# Patient Record
Sex: Female | Born: 1950
Health system: Southern US, Community
[De-identification: ages and names within clinical notes are randomized; demographics above are authoritative.]

## PROBLEM LIST (undated history)

## (undated) DIAGNOSIS — M722 Plantar fascial fibromatosis: Secondary | ICD-10-CM

## (undated) DIAGNOSIS — M810 Age-related osteoporosis without current pathological fracture: Secondary | ICD-10-CM

## (undated) DIAGNOSIS — R252 Cramp and spasm: Secondary | ICD-10-CM

## (undated) DIAGNOSIS — T7840XA Allergy, unspecified, initial encounter: Secondary | ICD-10-CM

## (undated) DIAGNOSIS — M1990 Unspecified osteoarthritis, unspecified site: Secondary | ICD-10-CM

## (undated) DIAGNOSIS — M75100 Unspecified rotator cuff tear or rupture of unspecified shoulder, not specified as traumatic: Secondary | ICD-10-CM

## (undated) HISTORY — PX: OTHER SURGICAL HISTORY: SHX169

## (undated) HISTORY — PX: DENTAL SURGERY: SHX609

## (undated) HISTORY — DX: Cramp and spasm: R25.2

## (undated) HISTORY — DX: Age-related osteoporosis without current pathological fracture: M81.0

## (undated) HISTORY — DX: Plantar fascial fibromatosis: M72.2

## (undated) HISTORY — DX: Allergy, unspecified, initial encounter: T78.40XA

---

## 1999-09-30 ENCOUNTER — Encounter: Admission: RE | Admit: 1999-09-30 | Discharge: 1999-09-30 | Payer: Self-pay | Admitting: Obstetrics and Gynecology

## 1999-09-30 ENCOUNTER — Encounter: Payer: Self-pay | Admitting: Obstetrics and Gynecology

## 2009-05-23 LAB — HM MAMMOGRAPHY: HM Mammogram: ABNORMAL

## 2011-10-26 ENCOUNTER — Ambulatory Visit (INDEPENDENT_AMBULATORY_CARE_PROVIDER_SITE_OTHER): Payer: BC Managed Care – PPO | Admitting: Sports Medicine

## 2011-10-26 VITALS — BP 102/62 | Wt 122.0 lb

## 2011-10-26 DIAGNOSIS — M7750 Other enthesopathy of unspecified foot: Secondary | ICD-10-CM

## 2011-10-26 DIAGNOSIS — M7740 Metatarsalgia, unspecified foot: Secondary | ICD-10-CM | POA: Insufficient documentation

## 2011-10-26 NOTE — Assessment & Plan Note (Signed)
We have fitted some of her current orthotics with metatarsal cookies and given her a pair to take home.  She will do a trial of these for one month.

## 2011-10-26 NOTE — Progress Notes (Signed)
  Subjective:    Patient ID: Jeanne Webb, female    DOB: 1951/05/04, 61 y.o.   MRN: [id number]  HPI 61 y/o female is here c/o left foot pain.  No injury.  She was wearing orthotics at some point for plantar fasciitis which has resolved.  When she was wearing her orthotics she was having numbness on the lateral surface of the foot.  This improved when she stopped wearing the orthotics and purchased shoes with a wider toe box.  Her pain now is on the plantar surface at the forefoot.  This pain is made worse with barefoot walking.   Review of Systems     Objective:   Physical Exam  Left foot: Loss of the transverse arch with widening Hammering and splaying  of 2-4 toes Tenderness to palpation of 2-4 metatarsal heads There is collapse of the the 2-4 metatarsal heads with callus formation Tenderness to palpation over the sesamoid of the 1st metatarsal  Right foot: Loss of transverse arch Large bunion No tenderness of the metatarsal heads but there is callus formation  Gait: The pain is improved somewhat with metatarsal pads but more so with metatarsal cookies.         Assessment & Plan:

## 2012-07-08 ENCOUNTER — Encounter: Payer: Self-pay | Admitting: Geriatric Medicine

## 2012-08-25 ENCOUNTER — Other Ambulatory Visit: Payer: Self-pay | Admitting: *Deleted

## 2012-08-25 DIAGNOSIS — Z0000 Encounter for general adult medical examination without abnormal findings: Secondary | ICD-10-CM

## 2012-08-26 ENCOUNTER — Other Ambulatory Visit: Payer: Self-pay

## 2012-08-26 ENCOUNTER — Other Ambulatory Visit: Payer: Self-pay | Admitting: Internal Medicine

## 2012-08-26 ENCOUNTER — Other Ambulatory Visit: Payer: Managed Care, Other (non HMO) | Admitting: Internal Medicine

## 2012-08-26 DIAGNOSIS — Z0000 Encounter for general adult medical examination without abnormal findings: Secondary | ICD-10-CM

## 2012-09-01 ENCOUNTER — Encounter: Payer: Self-pay | Admitting: Internal Medicine

## 2012-09-01 ENCOUNTER — Ambulatory Visit (INDEPENDENT_AMBULATORY_CARE_PROVIDER_SITE_OTHER): Payer: Managed Care, Other (non HMO) | Admitting: Internal Medicine

## 2012-09-01 VITALS — BP 118/72 | HR 68 | Temp 97.9°F | Resp 16 | Wt 123.4 lb

## 2012-09-01 DIAGNOSIS — M169 Osteoarthritis of hip, unspecified: Secondary | ICD-10-CM

## 2012-09-01 DIAGNOSIS — E538 Deficiency of other specified B group vitamins: Secondary | ICD-10-CM

## 2012-09-01 DIAGNOSIS — M1612 Unilateral primary osteoarthritis, left hip: Secondary | ICD-10-CM

## 2012-09-01 DIAGNOSIS — M7750 Other enthesopathy of unspecified foot: Secondary | ICD-10-CM

## 2012-09-01 DIAGNOSIS — E785 Hyperlipidemia, unspecified: Secondary | ICD-10-CM

## 2012-09-01 DIAGNOSIS — M1610 Unilateral primary osteoarthritis, unspecified hip: Secondary | ICD-10-CM

## 2012-09-01 DIAGNOSIS — M7740 Metatarsalgia, unspecified foot: Secondary | ICD-10-CM

## 2012-09-01 NOTE — Assessment & Plan Note (Signed)
Recommended increased activity, exercise to loosen up muscles.

## 2012-09-01 NOTE — Assessment & Plan Note (Signed)
Doing fine with use of cookie.  Still feels the plantar fasciitis at times when running with the grandchildren.

## 2012-09-01 NOTE — Progress Notes (Signed)
  Subjective:    Patient ID: Jeanne Webb, female    DOB: Mar 28, 1951, 62 y.o.   MRN: [id number]  HPI  C/o left hip being tight.  Requests massage prescription.  Has had xrays in the past and has known osteoarthritis present.  Sleeps on back so does not know if it hurts if she sleeps on the left side.  If stands a lot, has left-sided radicular pain.    Metatarsalgia and plantar fasciitis are improved.    Has not been exercising and watching diet too well lately.    Review of Systems   General:  Denies fatigue, denies weight loss Skin:  Denies rashes, denies concerning changes HEENT:  Denies headaches, denies visual changes, denies hearing loss, denies nasal congestion, denies sore throat, denies difficulty swallowing CV:  Denies chest pain, denies dyspnea on exertion, denies orthopnea, denies PND, denies edema Pulm:  Denies shortness of breath, denies wheezing, denies cough GI:  Denies abdominal pain, denies constipation, denies diarrhea, denies melena, denies hematochezia, denies nausea, denies vomiting GU:  Denies urinary frequency, denies urinary urgency, denies dysuria, denies nocturia, denies urinary incontinence Neurologic:  Denies paresthesias, denies new focal weakness MSK:  C/o left hip pain, denies back pain, denies muscle pain Hematology:  Denies bleeding, denies bruising Immunology:  Denies recurrent infections Psychiatry:  Denies changes in memory, denies depression, denies anxiety  Objective:   Physical Exam General:  Well-developed, well-nourished, well-groomed pt in NAD Skin:  No visible lesions on inspection CV:  RRR, no M/G/R, +S1/S2 Pulm:  Lungs CTA, no r/r/w Abd:  Soft, nondistended, nontender, no rebound or guarding, no palpable hepatosplenomegaly GU:  Bladder nontender, nondistended, no CVA tenderness MSK:  5/5 strength in all 4 extremities with normal ROM, gait steady w/o use of assistive device, has mild left hip tenderness at femoral head Psych:  AAOx3,  affect normal    Assessment & Plan:  Osteoarthritis of left hip Recommended increased activity, exercise to loosen up muscles.    Other and unspecified hyperlipidemia LDL is slightly elevated.  HDL is excellent.  Encouraged low cholesterol diet and exercise to promote decreased LDL and increase HDL even more.    Metatarsalgia Doing fine with use of cookie.  Still feels the plantar fasciitis at times when running with the grandchildren.

## 2012-09-01 NOTE — Assessment & Plan Note (Signed)
LDL is slightly elevated.  HDL is excellent.  Encouraged low cholesterol diet and exercise to promote decreased LDL and increase HDL even more.

## 2012-09-01 NOTE — Patient Instructions (Signed)
Increase your physical activity.  Consider heat and ice alternating for about 20 mins each on your left hip.  This may work best right before you exercise to loosen up the joint.    Maintain a low fat, low cholesterol diet.  (see book)

## 2013-03-31 ENCOUNTER — Encounter: Payer: Managed Care, Other (non HMO) | Admitting: Internal Medicine

## 2013-08-04 ENCOUNTER — Ambulatory Visit (INDEPENDENT_AMBULATORY_CARE_PROVIDER_SITE_OTHER): Payer: Managed Care, Other (non HMO) | Admitting: Family Medicine

## 2013-08-04 VITALS — BP 118/68 | HR 83 | Temp 98.4°F | Resp 16 | Ht 65.0 in | Wt 124.0 lb

## 2013-08-04 DIAGNOSIS — A088 Other specified intestinal infections: Secondary | ICD-10-CM

## 2013-08-04 DIAGNOSIS — A084 Viral intestinal infection, unspecified: Secondary | ICD-10-CM

## 2013-08-04 DIAGNOSIS — R197 Diarrhea, unspecified: Secondary | ICD-10-CM

## 2013-08-04 DIAGNOSIS — R110 Nausea: Secondary | ICD-10-CM

## 2013-08-04 LAB — POCT INFLUENZA A/B
INFLUENZA A, POC: NEGATIVE
INFLUENZA B, POC: NEGATIVE

## 2013-08-04 MED ORDER — ONDANSETRON 4 MG PO TBDP: ORAL_TABLET | ORAL | Status: DC

## 2013-08-04 NOTE — Progress Notes (Signed)
Subjective:  Results for orders placed in visit on 08/04/13  POCT INFLUENZA A/B      Result Value Ref Range   Influenza A, POC Negative     Influenza B, POC Negative

## 2013-08-04 NOTE — Patient Instructions (Signed)
Continue to drink plenty of clear liquids  Avoid dairy products  Avoid rich or spicy food, eating only very bland such as crackers or toast or broth tonight. Gradually advance her diet tomorrow as tolerated.  Return if worse, especially with high fevers, abdominal pain, passing blood, or any other major things of concern.  Use the Zofran if needed for nausea or vomiting

## 2013-09-04 ENCOUNTER — Other Ambulatory Visit: Payer: Managed Care, Other (non HMO)

## 2013-09-07 ENCOUNTER — Ambulatory Visit: Payer: Managed Care, Other (non HMO) | Admitting: Internal Medicine

## 2013-09-29 ENCOUNTER — Ambulatory Visit: Payer: Managed Care, Other (non HMO) | Admitting: Internal Medicine

## 2013-12-07 ENCOUNTER — Ambulatory Visit: Payer: Managed Care, Other (non HMO) | Admitting: Internal Medicine

## 2014-01-02 ENCOUNTER — Other Ambulatory Visit: Payer: Managed Care, Other (non HMO)

## 2014-01-02 DIAGNOSIS — E785 Hyperlipidemia, unspecified: Secondary | ICD-10-CM

## 2014-01-02 DIAGNOSIS — E538 Deficiency of other specified B group vitamins: Secondary | ICD-10-CM

## 2014-01-03 LAB — BASIC METABOLIC PANEL
BUN/Creatinine Ratio: 17 (ref 11–26)
BUN: 12 mg/dL (ref 8–27)
CO2: 25 mmol/L (ref 18–29)
Calcium: 9 mg/dL (ref 8.7–10.3)
Chloride: 99 mmol/L (ref 97–108)
Creatinine, Ser: 0.69 mg/dL (ref 0.57–1.00)
GFR calc Af Amer: 106 mL/min/{1.73_m2} (ref >59)
GFR calc non Af Amer: 92 mL/min/{1.73_m2} (ref >59)
Glucose: 89 mg/dL (ref 65–99)
Potassium: 4 mmol/L (ref 3.5–5.2)
Sodium: 140 mmol/L (ref 134–144)

## 2014-01-03 LAB — CBC WITH DIFFERENTIAL/PLATELET
Basophils Absolute: 0.1 10*3/uL (ref 0.0–0.2)
Basos: 2 %
Eos: 2 %
Eosinophils Absolute: 0.1 10*3/uL (ref 0.0–0.4)
HCT: 39 % (ref 34.0–46.6)
Hemoglobin: 13.5 g/dL (ref 11.1–15.9)
Immature Grans (Abs): 0 10*3/uL (ref 0.0–0.1)
Immature Granulocytes: 0 %
Lymphocytes Absolute: 1.9 10*3/uL (ref 0.7–3.1)
Lymphs: 42 %
MCH: 31.1 pg (ref 26.6–33.0)
MCHC: 34.6 g/dL (ref 31.5–35.7)
MCV: 90 fL (ref 79–97)
Monocytes Absolute: 0.3 10*3/uL (ref 0.1–0.9)
Monocytes: 6 %
Neutrophils Absolute: 2.1 10*3/uL (ref 1.4–7.0)
Neutrophils Relative %: 48 %
RBC: 4.34 x10E6/uL (ref 3.77–5.28)
RDW: 12.3 % (ref 12.3–15.4)
WBC: 4.5 10*3/uL (ref 3.4–10.8)

## 2014-01-03 LAB — LIPID PANEL
Chol/HDL Ratio: 2.1 ratio units (ref 0.0–4.4)
Cholesterol, Total: 209 mg/dL — ABNORMAL HIGH (ref 100–199)
HDL: 100 mg/dL (ref >39)
LDL Calculated: 98 mg/dL (ref 0–99)
Triglycerides: 56 mg/dL (ref 0–149)
VLDL Cholesterol Cal: 11 mg/dL (ref 5–40)

## 2014-01-03 LAB — B12 AND FOLATE PANEL
Folate: 17.6 ng/mL (ref >3.0)
Vitamin B-12: 1463 pg/mL — ABNORMAL HIGH (ref 211–946)

## 2014-01-04 ENCOUNTER — Ambulatory Visit: Payer: Self-pay | Admitting: Internal Medicine

## 2014-01-04 ENCOUNTER — Ambulatory Visit (INDEPENDENT_AMBULATORY_CARE_PROVIDER_SITE_OTHER): Payer: Managed Care, Other (non HMO) | Admitting: Internal Medicine

## 2014-01-04 ENCOUNTER — Encounter: Payer: Self-pay | Admitting: *Deleted

## 2014-01-04 ENCOUNTER — Encounter: Payer: Self-pay | Admitting: Internal Medicine

## 2014-01-04 VITALS — BP 108/70 | HR 67 | Temp 98.0°F | Ht 65.0 in | Wt 119.2 lb

## 2014-01-04 DIAGNOSIS — M1612 Unilateral primary osteoarthritis, left hip: Secondary | ICD-10-CM

## 2014-01-04 DIAGNOSIS — Z1231 Encounter for screening mammogram for malignant neoplasm of breast: Secondary | ICD-10-CM

## 2014-01-04 DIAGNOSIS — M7750 Other enthesopathy of unspecified foot: Secondary | ICD-10-CM

## 2014-01-04 DIAGNOSIS — M1610 Unilateral primary osteoarthritis, unspecified hip: Secondary | ICD-10-CM

## 2014-01-04 DIAGNOSIS — Z13220 Encounter for screening for lipoid disorders: Secondary | ICD-10-CM

## 2014-01-04 DIAGNOSIS — M7740 Metatarsalgia, unspecified foot: Secondary | ICD-10-CM

## 2014-01-04 NOTE — Progress Notes (Signed)
Patient ID: Jeanne Webb, female   DOB: 1950/10/16, 63 y.o.   MRN: [id number]   Location:  [facility]   Allergies  Allergen Reactions  . Lactose Intolerance (Gi)   . Penicillins   . Seldane [Terfenadine]   . Water Oral [Alimentum]     Chief Complaint  Patient presents with  . Annual Exam    Physical with labs prior.  Marland Kitchen other    declines Colonoscopy, needs Mammogram, and Dexa done @ [facility] 2-3 yrs ago  . Immunizations    declines all vaccines    HPI: Patient is a 63 y.o. white female seen in the office today for annual exam.    Continues to go to homeopathic medicine specialist in [place].  Went to dentist in [place].  Has a book of bloodwork from there.  Felt to have muscle spasms in left thigh--went to three times per week, and also was started on borswelia.  Refuses to acknowledge arthritis.  Seeing Ruby at the gym for massage.  Continues to do pilates now weekly.  Is trying to use her upper back to prevent injuring her neck.    Busy studying so has not been able to enjoy painting lately.  Had some urethral discomfort and wonders if it looks normal.  Was using small pads and could have been irritating.  Stress incontinence with sneezing.  Review of Systems:  Review of Systems  Constitutional: Negative for fever, chills and malaise/fatigue.  HENT: Negative for congestion.   Eyes: Negative for blurred vision.  Respiratory: Negative for cough and shortness of breath.   Cardiovascular: Negative for chest pain and leg swelling.  Gastrointestinal: Negative for abdominal pain, constipation, blood in stool and melena.  Genitourinary: Positive for dysuria.  Musculoskeletal: Positive for back pain, myalgias and neck pain. Negative for falls.  Skin: Negative for rash.  Neurological: Negative for dizziness, loss of consciousness, weakness and headaches.  Endo/Heme/Allergies: Does not bruise/bleed easily.    Psychiatric/Behavioral: Negative for depression and memory loss.    Past Medical History  Diagnosis Date  . Plantar fascial fibromatosis   . Cramp of limb     History reviewed. No pertinent past surgical history.  Social History:   reports that she has quit smoking. Her smoking use included Cigarettes. She smoked 0.00 packs per day. She does not have any smokeless tobacco history on file. She reports that she does not drink alcohol or use illicit drugs.  Family History  Problem Relation Age of Onset  . Cancer Mother     leukemia, ovarian, breast  . Stroke Father   . Heart disease Brother     Medications: Patient's Medications  New Prescriptions   No medications on file  Previous Medications   ASCORBIC ACID (VITAMIN C) 1000 MG TABLET    Take 1,000 mg by mouth 2 (two) times daily.   B COMPLEX VITAMINS TABLET    Take 1 tablet by mouth daily.   BOSWELLIA SERRATA (BOSWELLIA PO)    Take by mouth. Take 2 tablets daily on the AM   CALCIUM-MAGNESIUM-ZINC 333-133-5 MG TABS    Take two tablets in the morning and two tablets at lunch time   CHOLECALCIFEROL (VITAMIN D) 1000 UNITS TABLET    Take 1,000 Units by mouth 3 (three) times daily.   MAGNESIUM 30 MG TABLET    Take one tablet twice daily   NUTRITIONAL SUPPLEMENTS (JUICE PLUS FIBRE) LIQD    Take two tablet once daily  VITAMIN E 400 UNIT CAPSULE    Take one tablet once daily for supplement  Modified Medications   No medications on file  Discontinued Medications   ONDANSETRON (ZOFRAN ODT) 4 MG DISINTEGRATING TABLET    Take one every 4-6 hours if needed for nausea/vomiting.  May dissolve in mouth.     Physical Exam: Filed Vitals:   01/04/14 1011  BP: 108/70  Pulse: 67  Temp: 98 F (36.7 C)  TempSrc: Oral  Height: 5\' 5"  (1.651 m)  Weight: 119 lb 3.2 oz (54.069 kg)  SpO2: 97%  Physical Exam  Constitutional: She is oriented to person, place, and time. She appears well-developed and well-nourished. No distress.  HENT:   Head: Normocephalic and atraumatic.  Right Ear: External ear normal.  Left Ear: External ear normal.  Nose: Nose normal.  Mouth/Throat: Oropharynx is clear and moist. No oropharyngeal exudate.  TMs pink  Eyes: Conjunctivae and EOM are normal. Pupils are equal, round, and reactive to light.  Neck: Normal range of motion. Neck supple. No JVD present. No thyromegaly present.  Cardiovascular: Normal rate, regular rhythm, normal heart sounds and intact distal pulses.   Pulmonary/Chest: Effort normal and breath sounds normal. No respiratory distress.  Abdominal: Soft. Bowel sounds are normal. She exhibits no distension and no mass. There is no tenderness.  Musculoskeletal: Normal range of motion.  Lymphadenopathy:    She has no cervical adenopathy.  Neurological: She is alert and oriented to person, place, and time. She has normal reflexes. No cranial nerve deficit.  Skin: Skin is warm and dry.  Psychiatric: She has a normal mood and affect. Her behavior is normal. Judgment and thought content normal.    Labs reviewed: Basic Metabolic Panel:  Recent Labs  01/02/14 0814  NA 140  K 4.0  CL 99  CO2 25  GLUCOSE 89  BUN 12  CREATININE 0.69  CALCIUM 9.0  CBC:  Recent Labs  01/02/14 0814  WBC 4.5  NEUTROABS 2.1  HGB 13.5  HCT 39.0  MCV 90   Lipid Panel:  Recent Labs  01/02/14 0814  HDL 100  LDLCALC 98  TRIG 56  CHOLHDL 2.1   Assessment/Plan 1. Metatarsalgia, unspecified laterality -continues to be problematic for her and she believes she can reverse the condition  2. Primary osteoarthritis of left hip -says she will not accept arthritis as part of her vocabulary -seeing chiropractor and massage therapist as well as homeopathic doctor to help with this and has gone back to doing weekly pilates  3. Screening, lipid - last lipids at goal with excellent HDL--will need annual f/u of labs: CBC With differential/Platelet; Future - Comprehensive metabolic panel;  Future - Lipid panel; Future  4. Other screening mammogram -referral done to [facility] Dr. Isaiah Blakes today - MM DIGITAL SCREENING BILATERAL; Future  Labs/tests ordered: Orders Placed This Encounter  Procedures  . MM DIGITAL SCREENING BILATERAL    Standing Status: Future     Number of Occurrences:      Standing Expiration Date: 03/06/2015    Order Specific Question:  Reason for exam:    Answer:  routine screening overdue    Order Specific Question:  Preferred imaging location?    Answer:  External     Comments:  [facility]  . CBC With differential/Platelet    Standing Status: Future     Number of Occurrences:      Standing Expiration Date: 01/05/2016  . Comprehensive metabolic panel    Standing Status: Future  Number of Occurrences:      Standing Expiration Date: 01/05/2016  . Lipid panel    Standing Status: Future     Number of Occurrences:      Standing Expiration Date: 01/05/2016    Next appt:  1 year with labs before

## 2014-02-13 LAB — HM MAMMOGRAPHY

## 2014-05-10 DIAGNOSIS — Z029 Encounter for administrative examinations, unspecified: Secondary | ICD-10-CM

## 2015-01-09 ENCOUNTER — Other Ambulatory Visit: Payer: Managed Care, Other (non HMO)

## 2015-01-09 DIAGNOSIS — Z13220 Encounter for screening for lipoid disorders: Secondary | ICD-10-CM

## 2015-01-10 LAB — COMPREHENSIVE METABOLIC PANEL
ALT: 18 IU/L (ref 0–32)
AST: 18 IU/L (ref 0–40)
Albumin/Globulin Ratio: 1.9 (ref 1.1–2.5)
Albumin: 4.2 g/dL (ref 3.6–4.8)
Alkaline Phosphatase: 69 IU/L (ref 39–117)
BUN/Creatinine Ratio: 14 (ref 11–26)
BUN: 9 mg/dL (ref 8–27)
Bilirubin Total: 0.4 mg/dL (ref 0.0–1.2)
CO2: 26 mmol/L (ref 18–29)
Calcium: 9.4 mg/dL (ref 8.7–10.3)
Chloride: 100 mmol/L (ref 97–108)
Creatinine, Ser: 0.66 mg/dL (ref 0.57–1.00)
GFR calc Af Amer: 106 mL/min/{1.73_m2} (ref >59)
GFR calc non Af Amer: 92 mL/min/{1.73_m2} (ref >59)
Globulin, Total: 2.2 g/dL (ref 1.5–4.5)
Glucose: 91 mg/dL (ref 65–99)
Potassium: 4.4 mmol/L (ref 3.5–5.2)
Sodium: 140 mmol/L (ref 134–144)
Total Protein: 6.4 g/dL (ref 6.0–8.5)

## 2015-01-10 LAB — CBC WITH DIFFERENTIAL
Basophils Absolute: 0.1 10*3/uL (ref 0.0–0.2)
Basos: 2 %
EOS (ABSOLUTE): 0.1 10*3/uL (ref 0.0–0.4)
Eos: 2 %
Hematocrit: 39.1 % (ref 34.0–46.6)
Hemoglobin: 13.7 g/dL (ref 11.1–15.9)
Immature Grans (Abs): 0 10*3/uL (ref 0.0–0.1)
Immature Granulocytes: 0 %
Lymphocytes Absolute: 1.5 10*3/uL (ref 0.7–3.1)
Lymphs: 32 %
MCH: 31.6 pg (ref 26.6–33.0)
MCHC: 35 g/dL (ref 31.5–35.7)
MCV: 90 fL (ref 79–97)
Monocytes Absolute: 0.3 10*3/uL (ref 0.1–0.9)
Monocytes: 7 %
Neutrophils Absolute: 2.7 10*3/uL (ref 1.4–7.0)
Neutrophils: 57 %
RBC: 4.33 x10E6/uL (ref 3.77–5.28)
RDW: 12.8 % (ref 12.3–15.4)
WBC: 4.6 10*3/uL (ref 3.4–10.8)

## 2015-01-10 LAB — LIPID PANEL
Chol/HDL Ratio: 2.1 ratio units (ref 0.0–4.4)
Cholesterol, Total: 209 mg/dL — ABNORMAL HIGH (ref 100–199)
HDL: 101 mg/dL (ref >39)
LDL Calculated: 97 mg/dL (ref 0–99)
Triglycerides: 55 mg/dL (ref 0–149)
VLDL Cholesterol Cal: 11 mg/dL (ref 5–40)

## 2015-01-11 ENCOUNTER — Encounter: Payer: Self-pay | Admitting: Internal Medicine

## 2015-01-11 ENCOUNTER — Ambulatory Visit (INDEPENDENT_AMBULATORY_CARE_PROVIDER_SITE_OTHER): Payer: Managed Care, Other (non HMO) | Admitting: Internal Medicine

## 2015-01-11 VITALS — BP 102/66 | HR 76 | Temp 98.1°F | Resp 18 | Ht 65.0 in | Wt 123.0 lb

## 2015-01-11 DIAGNOSIS — M7740 Metatarsalgia, unspecified foot: Secondary | ICD-10-CM

## 2015-01-11 DIAGNOSIS — Z87448 Personal history of other diseases of urinary system: Secondary | ICD-10-CM | POA: Diagnosis not present

## 2015-01-11 DIAGNOSIS — Z124 Encounter for screening for malignant neoplasm of cervix: Secondary | ICD-10-CM

## 2015-01-11 DIAGNOSIS — E785 Hyperlipidemia, unspecified: Secondary | ICD-10-CM

## 2015-01-11 DIAGNOSIS — Z0000 Encounter for general adult medical examination without abnormal findings: Secondary | ICD-10-CM | POA: Diagnosis not present

## 2015-01-11 DIAGNOSIS — M1612 Unilateral primary osteoarthritis, left hip: Secondary | ICD-10-CM

## 2015-01-11 NOTE — Progress Notes (Signed)
Patient ID: Jeanne Webb, female   DOB: 25-Apr-1951, 64 y.o.   MRN: [id number]   Location:  [facility]  Goals of Care: Advanced Directive information Does patient have an advance directive?: Yes, Type of Advance Directive: Rand;Living will, Does patient want to make changes to advanced directive?: No - Patient declined  Chief Complaint  Patient presents with  . Annual Exam    Annual exam  . Medical Management of Chronic Issues    HPI: Patient is a 64 y.o. white female seen in the office today for her annual exam and medical mgt of chronic diseases.    MMSE - Mini Mental State Exam 01/11/2015  Orientation to time 5  Orientation to Place 5  Registration 3  Attention/ Calculation 5  Recall 3  Language- name 2 objects 2  Language- repeat 1  Language- follow 3 step command 3  Language- read & follow direction 1  Write a sentence 1  Copy design 1  Total score 30   Depression screen [facility] 2/9 01/11/2015 01/04/2014 09/01/2012  Decreased Interest 0 0 0  Down, Depressed, Hopeless 0 0 0  PHQ - 2 Score 0 0 0   Fall Risk  01/11/2015 01/04/2014 09/01/2012  Falls in the past year? No No No    There is no immunization history on file for this patient. Refuses all.  Wants pap smear done. Mammogram done 02/13/14 and normal.  Bone density:  Was normal a long time ago.  Is not interested in having one.  Says she does eat a lot of nuts.  Is lactose intolerant.  Uses a swig supplement.     Hyperlipidemia:  Was at goal without medications.    OA:  Says she needs to get back to working out.  Left leg still bothers her at times--still cannot sit cross-legged.  Gross hematuria historically:  Did get a UA done today.  No additional hematuria.     Review of Systems:  Review of Systems  Constitutional: Negative for fever and chills.  HENT: Negative for hearing loss.   Eyes:       Wears glasses--needs new eyeglasses--computer getting  fuzzy  Respiratory: Negative for cough and shortness of breath.   Cardiovascular: Negative for chest pain.  Gastrointestinal: Positive for diarrhea. Negative for abdominal pain, blood in stool and melena.       Stools correlate with intake  Genitourinary: Positive for urgency. Negative for dysuria, frequency and hematuria.       Wears pad for coughing, sneezing, jumping--stable with  kegels  Musculoskeletal: Positive for joint pain. Negative for falls.       Left hip  Skin: Negative for itching and rash.  Neurological: Negative for dizziness, loss of consciousness and headaches.  Endo/Heme/Allergies: Positive for environmental allergies.  Psychiatric/Behavioral: Negative for depression and memory loss. The patient does not have insomnia.        Got memory foam bed which was too soft    Past Medical History  Diagnosis Date  . Plantar fascial fibromatosis   . Cramp of limb     History reviewed. No pertinent past surgical history.  Allergies  Allergen Reactions  . Lactose Intolerance (Gi)   . Penicillins   . Seldane [Terfenadine]   . Water Oral [Alimentum]    Medications: Patient's Medications  New Prescriptions   No medications on file  Previous Medications   ASCORBIC ACID (VITAMIN C PO)    Take 2,000 mg  by mouth daily.   B COMPLEX VITAMINS TABLET    Take 1 tablet by mouth daily.   CALCIUM-MAGNESIUM-ZINC 333-133-5 MG TABS    Take two tablets in the morning and two tablets at lunch time   CHOLECALCIFEROL (VITAMIN D) 1000 UNITS TABLET    Take 1,000 Units by mouth 3 (three) times daily.   MAGNESIUM 30 MG TABLET    Take one tablet twice daily   NUTRITIONAL SUPPLEMENTS (JUICE PLUS FIBRE PO)    Take by mouth. 2 capsules by mouth once daily   VITAMIN E 400 UNIT CAPSULE    Take one tablet once daily for supplement  Modified Medications   No medications on file  Discontinued Medications   ASCORBIC ACID (VITAMIN C) 1000 MG TABLET    Take 2 tablet by mouth once daily   BOSWELLIA  SERRATA (BOSWELLIA PO)    Take by mouth. Take 2 tablets daily on the AM   NUTRITIONAL SUPPLEMENTS (JUICE PLUS FIBRE PO)    Take 2 capsule by mouth twice  daily   NUTRITIONAL SUPPLEMENTS (JUICE PLUS FIBRE) LIQD    Take two tablet once daily    Physical Exam: Filed Vitals:   01/11/15 0900  BP: 102/66  Pulse: 76  Temp: 98.1 F (36.7 C)  TempSrc: Oral  Resp: 18  Height: 5\' 5"  (1.651 m)  Weight: 123 lb (55.792 kg)  SpO2: 98%   Physical Exam  Constitutional: She is oriented to person, place, and time. She appears well-developed and well-nourished. No distress.  HENT:  Head: Normocephalic and atraumatic.  Right Ear: External ear normal.  Left Ear: External ear normal.  Nose: Nose normal.  Mouth/Throat: Oropharynx is clear and moist. No oropharyngeal exudate.  Eyes: Conjunctivae and EOM are normal. Pupils are equal, round, and reactive to light.  Neck: Normal range of motion. Neck supple. No JVD present. No tracheal deviation present. No thyromegaly present.  Cardiovascular: Normal rate, regular rhythm, normal heart sounds and intact distal pulses.   Pulmonary/Chest: Effort normal and breath sounds normal. No respiratory distress. Right breast exhibits no inverted nipple, no mass, no nipple discharge, no skin change and no tenderness. Left breast exhibits no inverted nipple, no mass, no nipple discharge, no skin change and no tenderness.  Abdominal: Soft. Bowel sounds are normal. She exhibits no distension. There is no tenderness.  Genitourinary: Vagina normal and uterus normal. No vaginal discharge found.  Musculoskeletal: Normal range of motion. She exhibits no edema or tenderness.  Neurological: She is alert and oriented to person, place, and time. She has normal reflexes. No cranial nerve deficit. Coordination normal.  Skin: Skin is warm and dry.  Psychiatric: She has a normal mood and affect. Her behavior is normal. Judgment and thought content normal.    Labs reviewed: Basic  Metabolic Panel:  Recent Labs  01/09/15 0831  NA 140  K 4.4  CL 100  CO2 26  GLUCOSE 91  BUN 9  CREATININE 0.66  CALCIUM 9.4   Liver Function Tests:  Recent Labs  01/09/15 0831  AST 18  ALT 18  ALKPHOS 69  BILITOT 0.4  PROT 6.4   No results for input(s): LIPASE, AMYLASE in the last 8760 hours. No results for input(s): AMMONIA in the last 8760 hours. CBC:  Recent Labs  01/09/15 0831  WBC 4.6  NEUTROABS 2.7  HCT 39.1   Lipid Panel:  Recent Labs  01/09/15 0831  CHOL 209*  HDL 101  LDLCALC 97  TRIG 55  CHOLHDL 2.1  Procedures since last visit: Mammo 9/15 EKG today:  NSR with HR 76, no signs of acute ischemia or infarct Assessment/Plan 1. Hyperlipidemia - has elevated LDL with protective HDL and no heart disease known in parents (brother does have), no cardiac symptoms herself and normal EKG today -she is good with healthy diet and exercise plan - EKG 12-Lead performed - Comprehensive metabolic panel; Future - Lipid panel; Future  2. History of hematuria - requests we reassess her urine due to a prior result with blood -she has not seen any macroscopic hematuria herself - CBC with Differential/Platelet; Future - Urinalysis with Reflex Microscopic  3. 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 Metatarsalgia, unspecified laterality -did not complain of pain in her feet this visit -cont proper shoewear and return to exercise  5. Cervical cancer screening - PAP, Image Guided [[facility], [facility]]; Future (thin prep) performed -physical exam was unremarkable  Labs/tests ordered: Orders Placed This Encounter  Procedures  . CBC with Differential/Platelet    Standing Status: Future     Number of Occurrences:      Standing Expiration Date: 01/10/2017  . Comprehensive metabolic panel    Standing  Status: Future     Number of Occurrences:      Standing Expiration Date: 01/10/2017    Order Specific Question:  Has the patient fasted?    Answer:  Yes  . Lipid panel    Standing Status: Future     Number of Occurrences:      Standing Expiration Date: 01/10/2017    Order Specific Question:  Has the patient fasted?    Answer:  Yes  . Urinalysis with Reflex Microscopic  . EKG 12-Lead    Next appt: 1 year with labs before  Kedarius Aloisi L. Colbert Curenton, D.O. [facility] [address], [place] [address] Cell Phone (Mon-Fri 8am-5pm):  [phone] On Call:  [phone] & follow prompts after 5pm & weekends Office Phone:  [phone] Office Fax:  [phone]

## 2015-01-11 NOTE — Progress Notes (Signed)
Passed clock test 

## 2015-01-12 LAB — URINALYSIS, ROUTINE W REFLEX MICROSCOPIC
Bilirubin, UA: NEGATIVE
Glucose, UA: NEGATIVE
Leukocytes, UA: NEGATIVE
Nitrite, UA: NEGATIVE
Protein, UA: NEGATIVE
RBC, UA: NEGATIVE
Specific Gravity, UA: 1.021 (ref 1.005–1.030)
Urobilinogen, Ur: 0.2 mg/dL (ref 0.2–1.0)
pH, UA: 6 (ref 5.0–7.5)

## 2015-01-13 LAB — PAP IG (IMAGE GUIDED): PAP Smear Comment: 0

## 2015-04-04 NOTE — Telephone Encounter (Signed)
Opened in error

## 2015-08-21 ENCOUNTER — Ambulatory Visit: Payer: Managed Care, Other (non HMO) | Admitting: Internal Medicine

## 2015-10-01 ENCOUNTER — Ambulatory Visit (INDEPENDENT_AMBULATORY_CARE_PROVIDER_SITE_OTHER): Payer: Medicare Other | Admitting: Physician Assistant

## 2015-10-01 VITALS — BP 96/66 | HR 89 | Temp 99.2°F | Resp 18 | Ht 65.0 in | Wt 121.0 lb

## 2015-10-01 DIAGNOSIS — R197 Diarrhea, unspecified: Secondary | ICD-10-CM | POA: Diagnosis not present

## 2015-10-01 DIAGNOSIS — R112 Nausea with vomiting, unspecified: Secondary | ICD-10-CM | POA: Diagnosis not present

## 2015-10-01 DIAGNOSIS — R509 Fever, unspecified: Secondary | ICD-10-CM | POA: Diagnosis not present

## 2015-10-01 LAB — POCT INFLUENZA A/B
Influenza A, POC: NEGATIVE
Influenza B, POC: NEGATIVE

## 2015-10-01 NOTE — Patient Instructions (Signed)
     IF you received an x-ray today, you will receive an invoice from [facility]. Please contact  Radiology at [phone] with questions or concerns regarding your invoice.   IF you received labwork today, you will receive an invoice from [facility]. Please contact [facility] at [phone] with questions or concerns regarding your invoice.   Our billing staff will not be able to assist you with questions regarding bills from these companies.  You will be contacted with the lab results as soon as they are available. The fastest way to get your results is to activate your My Chart account. Instructions are located on the last page of this paperwork. If you have not heard from us regarding the results in 2 weeks, please contact this office.      

## 2015-10-01 NOTE — Progress Notes (Signed)
   Jeanne Webb  MRN: [id number] DOB: [birth year]  Subjective:  Pt presents to clinic with stomach virus symptoms that started last night which seem to be getting better but she wants to be tested for the Flu because she has been around her grandchildren and she wants to make sure she did not exposed them.   Patient Active Problem List   Diagnosis Date Noted  . Other and unspecified hyperlipidemia 09/01/2012  . Osteoarthritis of left hip 09/01/2012  . Metatarsalgia 10/26/2011    Current Outpatient Prescriptions on File Prior to Visit  Medication Sig Dispense Refill  . Ascorbic Acid (VITAMIN C PO) Take 2,000 mg by mouth daily.    Marland Kitchen b complex vitamins tablet Take 1 tablet by mouth daily.    . Calcium-Magnesium-Zinc 333-133-5 MG TABS Take two tablets in the morning and two tablets at lunch time    . cholecalciferol (VITAMIN D) 1000 UNITS tablet Take 1,000 Units by mouth 3 (three) times daily.    . magnesium 30 MG tablet Take one tablet twice daily    . Nutritional Supplements (JUICE PLUS FIBRE PO) Take by mouth. 2 capsules by mouth once daily    . vitamin E 400 UNIT capsule Take one tablet once daily for supplement     No current facility-administered medications on file prior to visit.    Allergies  Allergen Reactions  . Lactose Intolerance (Gi)   . Penicillins   . Seldane [Terfenadine]   . Water Oral [Alimentum]     Review of Systems  Constitutional: Positive for fever. Negative for chills.  HENT: Positive for ear pain (left ear).   Gastrointestinal: Positive for nausea, vomiting (<5 times last night) and diarrhea (4-5 times last night - normal small BM this am).  Allergic/Immunologic: Positive for environmental allergies.   Objective:  BP 96/66 mmHg  Pulse 89  Temp(Src) 99.2 F (37.3 C)  Resp 18  Ht 5\' 5"  (1.651 m)  Wt 121 lb (54.885 kg)  BMI 20.14 kg/m2  SpO2 94%  Physical Exam  Constitutional: She is oriented to person, place, and time and well-developed,  well-nourished, and in no distress.  HENT:  Head: Normocephalic and atraumatic.  Right Ear: Hearing, tympanic membrane, external ear and ear canal normal.  Left Ear: Hearing, tympanic membrane, external ear and ear canal normal.  Nose: Nose normal.  Mouth/Throat: Uvula is midline, oropharynx is clear and moist and mucous membranes are normal.  Eyes: Conjunctivae are normal.  Neck: Normal range of motion.  Cardiovascular: Normal rate, regular rhythm and normal heart sounds.   No murmur heard. Pulmonary/Chest: Effort normal and breath sounds normal.  Abdominal: Soft. Bowel sounds are normal. There is tenderness (generalized ).  Neurological: She is alert and oriented to person, place, and time. Gait normal.  Skin: Skin is warm and dry.  Psychiatric: Mood, memory, affect and judgment normal.  Vitals reviewed.  Results for orders placed or performed in visit on 10/01/15  POCT Influenza A/B  Result Value Ref Range   Influenza A, POC Negative Negative   Influenza B, POC Negative Negative    Assessment and Plan :  Diarrhea, unspecified type  Non-intractable vomiting with nausea, vomiting of unspecified type - Plan: POCT Influenza A/B   Symptomatic care for her GI illness.  Windell Hummingbird PA-C  [facility] 10/01/2015 1:33 PM

## 2016-01-15 NOTE — Addendum Note (Signed)
Addended by: Logan Bores on: 01/15/2016 02:47 PM   Modules accepted: Orders

## 2016-01-20 ENCOUNTER — Encounter: Payer: Self-pay | Admitting: *Deleted

## 2016-01-20 ENCOUNTER — Other Ambulatory Visit: Payer: Medicare Other

## 2016-01-20 DIAGNOSIS — Z87448 Personal history of other diseases of urinary system: Secondary | ICD-10-CM | POA: Diagnosis not present

## 2016-01-20 DIAGNOSIS — E785 Hyperlipidemia, unspecified: Secondary | ICD-10-CM | POA: Diagnosis not present

## 2016-01-20 LAB — COMPREHENSIVE METABOLIC PANEL
ALT: 18 U/L (ref 6–29)
AST: 22 U/L (ref 10–35)
Albumin: 4.1 g/dL (ref 3.6–5.1)
Alkaline Phosphatase: 67 U/L (ref 33–130)
BUN: 12 mg/dL (ref 7–25)
CO2: 30 mmol/L (ref 20–31)
Calcium: 9.2 mg/dL (ref 8.6–10.4)
Chloride: 103 mmol/L (ref 98–110)
Creat: 0.62 mg/dL (ref 0.50–0.99)
Glucose, Bld: 89 mg/dL (ref 65–99)
Potassium: 4.2 mmol/L (ref 3.5–5.3)
Sodium: 139 mmol/L (ref 135–146)
Total Bilirubin: 0.7 mg/dL (ref 0.2–1.2)
Total Protein: 6.5 g/dL (ref 6.1–8.1)

## 2016-01-20 LAB — CBC WITH DIFFERENTIAL/PLATELET
Basophils Absolute: 86 cells/uL (ref 0–200)
Basophils Relative: 2 %
Eosinophils Absolute: 129 cells/uL (ref 15–500)
Eosinophils Relative: 3 %
HCT: 39.6 % (ref 35.0–45.0)
Hemoglobin: 13.7 g/dL (ref 11.7–15.5)
Lymphs Abs: 1419 cells/uL (ref 850–3900)
MCH: 31.1 pg (ref 27.0–33.0)
MCHC: 34.6 g/dL (ref 32.0–36.0)
MCV: 90 fL (ref 80.0–100.0)
MPV: 10 fL (ref 7.5–12.5)
Monocytes Absolute: 344 cells/uL (ref 200–950)
Monocytes Relative: 8 %
Neutro Abs: 2322 cells/uL (ref 1500–7800)
Neutrophils Relative %: 54 %
Platelets: 304 10*3/uL (ref 140–400)
RBC: 4.4 MIL/uL (ref 3.80–5.10)
RDW: 12.6 % (ref 11.0–15.0)
WBC: 4.3 10*3/uL (ref 3.8–10.8)

## 2016-01-20 LAB — LIPID PANEL
Cholesterol: 200 mg/dL (ref 125–200)
HDL: 104 mg/dL (ref >=46)
LDL Cholesterol: 84 mg/dL (ref <130)
Total CHOL/HDL Ratio: 1.9 Ratio (ref <=5.0)
Triglycerides: 60 mg/dL (ref <150)
VLDL: 12 mg/dL (ref <30)

## 2016-01-24 ENCOUNTER — Ambulatory Visit (INDEPENDENT_AMBULATORY_CARE_PROVIDER_SITE_OTHER): Payer: Medicare Other | Admitting: Internal Medicine

## 2016-01-24 ENCOUNTER — Encounter: Payer: Self-pay | Admitting: Internal Medicine

## 2016-01-24 VITALS — BP 110/62 | HR 68 | Temp 98.1°F | Ht 65.0 in | Wt 115.0 lb

## 2016-01-24 DIAGNOSIS — E2839 Other primary ovarian failure: Secondary | ICD-10-CM

## 2016-01-24 DIAGNOSIS — Z1231 Encounter for screening mammogram for malignant neoplasm of breast: Secondary | ICD-10-CM

## 2016-01-24 DIAGNOSIS — Z0000 Encounter for general adult medical examination without abnormal findings: Secondary | ICD-10-CM | POA: Diagnosis not present

## 2016-01-24 DIAGNOSIS — Z780 Asymptomatic menopausal state: Secondary | ICD-10-CM | POA: Diagnosis not present

## 2016-01-24 DIAGNOSIS — E785 Hyperlipidemia, unspecified: Secondary | ICD-10-CM | POA: Diagnosis not present

## 2016-01-24 DIAGNOSIS — Z1159 Encounter for screening for other viral diseases: Secondary | ICD-10-CM

## 2016-01-24 DIAGNOSIS — M24552 Contracture, left hip: Secondary | ICD-10-CM | POA: Diagnosis not present

## 2016-01-24 DIAGNOSIS — Z1239 Encounter for other screening for malignant neoplasm of breast: Secondary | ICD-10-CM

## 2016-01-24 NOTE — Progress Notes (Signed)
Location:  [facility] clinic Provider: Millie Shorb L. Mariea Clonts, D.O., C.M.D.  Patient Care Team: Gayland Curry, DO as PCP - General (Geriatric Medicine)  Extended Emergency Contact Information Primary Emergency Contact: [facility] Address: [address]          [place], [place] [address] [place] Phone: [phone] Work Phone: [phone] Relation: Spouse Secondary Emergency Contact: Robinson,Wellsely Address: [address]          [place], [place] [address] [place] of [place] Phone: [phone] Mobile Phone: [phone] Relation: Daughter  Code Status: DNR Goals of Care: Advanced Directive information Advanced Directives 01/24/2016  Does patient have an advance directive? No  Type of Advance Directive -  Does patient want to make changes to advanced directive? -  Copy of advanced directive(s) in chart? -  Would patient like information on creating an advanced directive? Yes - Multimedia programmer Complaint  Patient presents with  . Annual Exam    physical exam    HPI: Patient is a 65 y.o. female seen in today for an annual wellness exam.    Refuses all vaccines. Refuses colon cancer screening.  Depression screen [facility] 2/9 01/24/2016 10/01/2015 01/11/2015 01/04/2014 09/01/2012  Decreased Interest 0 0 0 0 0  Down, Depressed, Hopeless 0 0 0 0 0  PHQ - 2 Score 0 0 0 0 0    Fall Risk  01/24/2016 10/01/2015 01/11/2015 01/04/2014 09/01/2012  Falls in the past year? No No No No No   MMSE - Mini Mental State Exam 01/24/2016 01/11/2015  Orientation to time 5 5  Orientation to Place 5 5  Registration 3 3  Attention/ Calculation 5 5  Recall 3 3  Language- name 2 objects 2 2  Language- repeat 1 1  Language- follow 3 step command 3 3  Language- read & follow direction 1 1  Write a sentence 1 1  Copy design 1 1  Total score 30 30  passed clock  Health Maintenance  Topic Date Due  . Hepatitis C Screening  10-01-1950  . COLONOSCOPY   11/29/1998  . DEXA SCAN  11/28/2013  . MAMMOGRAM  02/14/2016    Functional Status Survey: Is the patient deaf or have difficulty hearing?: Yes Does the patient have difficulty seeing, even when wearing glasses/contacts?: Yes Does the patient have difficulty concentrating, remembering, or making decisions?: No Does the patient have difficulty walking or climbing stairs?: No Does the patient have difficulty dressing or bathing?: No Does the patient have difficulty doing errands alone such as visiting a doctor's office or shopping?: No     Diet? Her vitamin drink is no longer available so she's been eating large amts of fruits and veggies instead Vision Screening Comments: Dr. [place] 2016 Hearing:  No problem Dentition:  No recent problems since changing how she brushes  Past Medical History:  Diagnosis Date  . Allergy   . Cramp of limb   . Plantar fascial fibromatosis     No past surgical history on file.  Family History  Problem Relation Age of Onset  . Cancer Mother     leukemia, ovarian, breast  . Stroke Father   . Heart disease Brother     Social History   Social History  . Marital status: Married    Spouse name: N/A  . Number of children: N/A  . Years of education: N/A   Social History Main Topics  . Smoking status: Former Smoker    Types:  Cigarettes  . Smokeless tobacco: Never Used  . Alcohol use No  . Drug use: No  . Sexual activity: Not Asked   Other Topics Concern  . None   Social History Narrative  . None    reports that she has quit smoking. Her smoking use included Cigarettes. She has never used smokeless tobacco. She reports that she does not drink alcohol or use drugs.  Allergies  Allergen Reactions  . Lactose Intolerance (Gi)   . Penicillins   . Seldane [Terfenadine]   . Water Oral [Alimentum]       Medication List       Accurate as of 01/24/16 11:59 PM. Always use your most recent med list.          b complex vitamins  tablet Take 1 tablet by mouth daily.   Calcium-Magnesium-Zinc 333-133-5 MG Tabs Take two tablets in the morning and two tablets at lunch time   cholecalciferol 1000 units tablet Commonly known as:  VITAMIN D Take 1,000 Units by mouth 3 (three) times daily.   Glucosamine-Chondroitin 1500-1200 MG/30ML Liqd Take by mouth daily.   JUICE PLUS FIBRE PO Take by mouth. 2 capsules by mouth once daily   magnesium 30 MG tablet Take one tablet twice daily   VITAMIN C PO Take 2,000 mg by mouth daily.   vitamin E 400 UNIT capsule Take one tablet once daily for supplement       Review of Systems:  Review of Systems  Constitutional: Negative for chills, fever and malaise/fatigue.  HENT: Negative for congestion, hearing loss and sore throat.   Eyes: Negative for blurred vision.       New glasses  Respiratory: Negative for cough and shortness of breath.   Cardiovascular: Negative for chest pain, palpitations and leg swelling.  Gastrointestinal: Negative for abdominal pain, blood in stool, constipation, diarrhea, heartburn and melena.  Genitourinary: Negative for dysuria, frequency and urgency.  Musculoskeletal: Positive for joint pain. Negative for back pain, falls, myalgias and neck pain.  Skin: Negative for itching and rash.  Neurological: Negative for dizziness, loss of consciousness, weakness and headaches.  Endo/Heme/Allergies: Does not bruise/bleed easily.  Psychiatric/Behavioral: Negative for depression and memory loss. The patient is not nervous/anxious and does not have insomnia.     Physical Exam: Vitals:   01/24/16 0859  BP: 110/62  Pulse: 68  Temp: 98.1 F (36.7 C)  TempSrc: Oral  SpO2: 98%  Weight: 115 lb (52.2 kg)  Height: 5\' 5"  (1.651 m)   Body mass index is 19.14 kg/m. Physical Exam  Constitutional: She is oriented to person, place, and time. She appears well-developed and well-nourished. No distress.  HENT:  Head: Normocephalic and atraumatic.  Right Ear:  External ear normal.  Left Ear: External ear normal.  Nose: Nose normal.  Mouth/Throat: Oropharynx is clear and moist. No oropharyngeal exudate.  Eyes: Conjunctivae and EOM are normal. Pupils are equal, round, and reactive to light.  Neck: Normal range of motion. Neck supple. No JVD present. No tracheal deviation present. No thyromegaly present.  Cardiovascular: Normal rate, regular rhythm, normal heart sounds and intact distal pulses.   No murmur heard. Pulmonary/Chest: Effort normal and breath sounds normal. No respiratory distress. She has no wheezes.  Abdominal: Soft. Bowel sounds are normal. She exhibits no distension and no mass. There is no tenderness. There is no rebound and no guarding. No hernia.  Musculoskeletal: Normal range of motion. She exhibits tenderness. She exhibits no edema or deformity.  Left lateral hip  Neurological: She is alert and oriented to person, place, and time. She has normal reflexes. No cranial nerve deficit.  Skin: Skin is warm.  Psychiatric: She has a normal mood and affect. Her behavior is normal. Judgment and thought content normal.    Labs reviewed: Basic Metabolic Panel:  Recent Labs  01/20/16 0845  NA 139  K 4.2  CL 103  CO2 30  GLUCOSE 89  BUN 12  CREATININE 0.62  CALCIUM 9.2   Liver Function Tests:  Recent Labs  01/20/16 0845  AST 22  ALT 18  ALKPHOS 67  BILITOT 0.7  PROT 6.5  ALBUMIN 4.1   No results for input(s): LIPASE, AMYLASE in the last 8760 hours. No results for input(s): AMMONIA in the last 8760 hours. CBC:  Recent Labs  01/20/16 0845  WBC 4.3  NEUTROABS 2,322  HGB 13.7  HCT 39.6  MCV 90.0  PLT 304   Lipid Panel:  Recent Labs  01/20/16 0845  CHOL 200  HDL 104  LDLCALC 84  TRIG 60  CHOLHDL 1.9   Assessment/Plan 1. Medicare annual wellness visit, subsequent -see hpi, refuses all vaccines and colon cancer screening--will try again to get her to do cologuard next year -did agree to mammogram and  will check bone density at the same time (I'm a bit concerned about this with her thin frame and unique dietary selections/allergies)  2. Hyperlipidemia - lipids great with high hdl, cont diet and exercise - EKG 12-Lead  3. Left hip flexor tightness -discussed appropriate stretches and use of a foam roller over the tight region  Postmenopausal - DG Bone Density; Future  Estrogen deficiency - DG Bone Density; Future  Encounter for screening mammogram for breast cancer - MM DIGITAL SCREENING BILATERAL; Future  Labs/tests ordered:   Orders Placed This Encounter  Procedures  . MM DIGITAL SCREENING BILATERAL    Standing Status:   Future    Number of Occurrences:   1    Standing Expiration Date:   03/25/2017    Order Specific Question:   Reason for exam:    Answer:   routine screening for breast cancer    Order Specific Question:   Preferred imaging location?    Answer:   External    Comments:   [facility]  . DG Bone Density    Standing Status:   Future    Standing Expiration Date:   03/25/2017    Order Specific Question:   Reason for Exam (SYMPTOM  OR DIAGNOSIS REQUIRED)    Answer:   postmenopausal, estrogen deficiency    Order Specific Question:   Preferred imaging location?    Answer:   External    Comments:   [facility]  . EKG 12-Lead    Next appt:  Annual visit in 1 year, labs before  [place]. Shameka Aggarwal, D.O. [facility] [address], [place] [address] Cell Phone (Mon-Fri 8am-5pm):  [phone] On Call:  [phone] & follow prompts after 5pm & weekends Office Phone:  [phone] Office Fax:  [phone]

## 2016-02-20 ENCOUNTER — Encounter: Payer: Self-pay | Admitting: Internal Medicine

## 2016-02-21 DIAGNOSIS — Z853 Personal history of malignant neoplasm of breast: Secondary | ICD-10-CM | POA: Diagnosis not present

## 2016-02-21 DIAGNOSIS — Z1231 Encounter for screening mammogram for malignant neoplasm of breast: Secondary | ICD-10-CM | POA: Diagnosis not present

## 2016-02-21 LAB — HM MAMMOGRAPHY

## 2016-06-03 DIAGNOSIS — M810 Age-related osteoporosis without current pathological fracture: Secondary | ICD-10-CM | POA: Diagnosis not present

## 2016-06-03 LAB — HM DEXA SCAN

## 2016-06-19 ENCOUNTER — Telehealth: Payer: Self-pay | Admitting: *Deleted

## 2016-06-19 NOTE — Telephone Encounter (Signed)
.  left message to have patient return my call to discuss results from her bone density.  Per Dr. Mariea Clonts " all areas that were measured both times have shown a decrease in density. Osteoporosis right hip and lumbar spine, osteopenia in other hip, be sure she is taking at least 1200 mg calcium and 2000 units of vitamin D, ideally needs medication also"

## 2016-07-16 ENCOUNTER — Ambulatory Visit: Payer: Self-pay | Admitting: Nurse Practitioner

## 2016-08-12 ENCOUNTER — Telehealth: Payer: Self-pay | Admitting: *Deleted

## 2016-08-12 MED ORDER — OSELTAMIVIR PHOSPHATE 75 MG PO CAPS: 75.0000 mg | ORAL_CAPSULE | Freq: Every day | ORAL | 0 refills | Status: DC

## 2016-08-12 NOTE — Telephone Encounter (Signed)
I called patient's husband (patient of Dr.Green's) to inform him that Dr.Green ok'd rx for tamiflu and he asked what about my wife. Dr.Green was standing in front of me at the time of cal, Dr.Green ok'd for me to send in rx for patient's wife as well.  RX sent

## 2016-08-12 NOTE — Telephone Encounter (Signed)
Patient called and stated that she was exposed to Positive Flu last night by grandkids. Only symptom so far is runny nose. Wonders if she should go ahead and start on Tamiflu. Please Advise.

## 2016-08-20 ENCOUNTER — Ambulatory Visit: Payer: Self-pay | Admitting: Internal Medicine

## 2016-09-28 ENCOUNTER — Encounter: Payer: Self-pay | Admitting: Internal Medicine

## 2016-10-07 DIAGNOSIS — H2513 Age-related nuclear cataract, bilateral: Secondary | ICD-10-CM | POA: Diagnosis not present

## 2016-11-11 ENCOUNTER — Encounter: Payer: Self-pay | Admitting: Internal Medicine

## 2017-02-10 ENCOUNTER — Ambulatory Visit (INDEPENDENT_AMBULATORY_CARE_PROVIDER_SITE_OTHER): Payer: Medicare Other

## 2017-02-10 ENCOUNTER — Other Ambulatory Visit: Payer: Medicare Other

## 2017-02-10 VITALS — BP 118/72 | HR 68 | Temp 97.6°F | Ht 65.0 in | Wt 111.0 lb

## 2017-02-10 DIAGNOSIS — Z780 Asymptomatic menopausal state: Secondary | ICD-10-CM

## 2017-02-10 DIAGNOSIS — E2839 Other primary ovarian failure: Secondary | ICD-10-CM | POA: Diagnosis not present

## 2017-02-10 DIAGNOSIS — Z0000 Encounter for general adult medical examination without abnormal findings: Secondary | ICD-10-CM | POA: Diagnosis not present

## 2017-02-10 DIAGNOSIS — Z1159 Encounter for screening for other viral diseases: Secondary | ICD-10-CM | POA: Diagnosis not present

## 2017-02-10 DIAGNOSIS — E785 Hyperlipidemia, unspecified: Secondary | ICD-10-CM

## 2017-02-10 NOTE — Patient Instructions (Addendum)
Jeanne Webb , Thank you for taking time to come for your Medicare Wellness Visit. I appreciate your ongoing commitment to your health goals. Please review the following plan we discussed and let me know if I can assist you in the future.   Screening recommendations/referrals: Colonoscopy due. We will get colorguard information for you Mammogram due 02/20/2018 Bone Density up to date Recommended yearly ophthalmology/optometry visit for glaucoma screening and checkup Recommended yearly dental visit for hygiene and checkup  Vaccinations: Influenza vaccine due, declined Pneumococcal vaccine due, declined Tdap vaccine due, declined Shingles vaccine due, declined  Advanced directives: Advance directive discussed with you today. I have provided a copy for you to complete at home and have notarized. Once this is complete please bring a copy in to our office so we can scan it into your chart.   Conditions/risks identified: None  Next appointment: Dr. Mariea Clonts 02/15/17 @10  am   Preventive Care 65 Years and Older, Female Preventive care refers to lifestyle choices and visits with your health care provider that can promote health and wellness. What does preventive care include?  A yearly physical exam. This is also called an annual well check.  Dental exams once or twice a year.  Routine eye exams. Ask your health care provider how often you should have your eyes checked.  Personal lifestyle choices, including:  Daily care of your teeth and gums.  Regular physical activity.  Eating a healthy diet.  Avoiding tobacco and drug use.  Limiting alcohol use.  Practicing safe sex.  Taking low-dose aspirin every day.  Taking vitamin and mineral supplements as recommended by your health care provider. What happens during an annual well check? The services and screenings done by your health care provider during your annual well check will depend on your age, overall health, lifestyle risk  factors, and family history of disease. Counseling  Your health care provider may ask you questions about your:  Alcohol use.  Tobacco use.  Drug use.  Emotional well-being.  Home and relationship well-being.  Sexual activity.  Eating habits.  History of falls.  Memory and ability to understand (cognition).  Work and work Statistician.  Reproductive health. Screening  You may have the following tests or measurements:  Height, weight, and BMI.  Blood pressure.  Lipid and cholesterol levels. These may be checked every 5 years, or more frequently if you are over 39 years old.  Skin check.  Lung cancer screening. You may have this screening every year starting at age 75 if you have a 30-pack-year history of smoking and currently smoke or have quit within the past 15 years.  Fecal occult blood test (FOBT) of the stool. You may have this test every year starting at age 4.  Flexible sigmoidoscopy or colonoscopy. You may have a sigmoidoscopy every 5 years or a colonoscopy every 10 years starting at age 96.  Hepatitis C blood test.  Hepatitis B blood test.  Sexually transmitted disease (STD) testing.  Diabetes screening. This is done by checking your blood sugar (glucose) after you have not eaten for a while (fasting). You may have this done every 1-3 years.  Bone density scan. This is done to screen for osteoporosis. You may have this done starting at age 81.  Mammogram. This may be done every 1-2 years. Talk to your health care provider about how often you should have regular mammograms. Talk with your health care provider about your test results, treatment options, and if necessary, the need for more  tests. Vaccines  Your health care provider may recommend certain vaccines, such as:  Influenza vaccine. This is recommended every year.  Tetanus, diphtheria, and acellular pertussis (Tdap, Td) vaccine. You may need a Td booster every 10 years.  Zoster vaccine. You  may need this after age 88.  Pneumococcal 13-valent conjugate (PCV13) vaccine. One dose is recommended after age 56.  Pneumococcal polysaccharide (PPSV23) vaccine. One dose is recommended after age 34. Talk to your health care provider about which screenings and vaccines you need and how often you need them. This information is not intended to replace advice given to you by your health care provider. Make sure you discuss any questions you have with your health care provider. Document Released: 06/21/2015 Document Revised: 02/12/2016 Document Reviewed: 03/26/2015 Elsevier Interactive Patient Education  2017 [place] Prevention in the Home Falls can cause injuries. They can happen to people of all ages. There are many things you can do to make your home safe and to help prevent falls. What can I do on the outside of my home?  Regularly fix the edges of walkways and driveways and fix any cracks.  Remove anything that might make you trip as you walk through a door, such as a raised step or threshold.  Trim any bushes or trees on the path to your home.  Use bright outdoor lighting.  Clear any walking paths of anything that might make someone trip, such as rocks or tools.  Regularly check to see if handrails are loose or broken. Make sure that both sides of any steps have handrails.  Any raised decks and porches should have guardrails on the edges.  Have any leaves, snow, or ice cleared regularly.  Use sand or salt on walking paths during winter.  Clean up any spills in your garage right away. This includes oil or grease spills. What can I do in the bathroom?  Use night lights.  Install grab bars by the toilet and in the tub and shower. Do not use towel bars as grab bars.  Use non-skid mats or decals in the tub or shower.  If you need to sit down in the shower, use a plastic, non-slip stool.  Keep the floor dry. Clean up any water that spills on the floor as soon as  it happens.  Remove soap buildup in the tub or shower regularly.  Attach bath mats securely with double-sided non-slip rug tape.  Do not have throw rugs and other things on the floor that can make you trip. What can I do in the bedroom?  Use night lights.  Make sure that you have a light by your bed that is easy to reach.  Do not use any sheets or blankets that are too big for your bed. They should not hang down onto the floor.  Have a firm chair that has side arms. You can use this for support while you get dressed.  Do not have throw rugs and other things on the floor that can make you trip. What can I do in the kitchen?  Clean up any spills right away.  Avoid walking on wet floors.  Keep items that you use a lot in easy-to-reach places.  If you need to reach something above you, use a strong step stool that has a grab bar.  Keep electrical cords out of the way.  Do not use floor polish or wax that makes floors slippery. If you must use wax, use non-skid floor  wax.  Do not have throw rugs and other things on the floor that can make you trip. What can I do with my stairs?  Do not leave any items on the stairs.  Make sure that there are handrails on both sides of the stairs and use them. Fix handrails that are broken or loose. Make sure that handrails are as long as the stairways.  Check any carpeting to make sure that it is firmly attached to the stairs. Fix any carpet that is loose or worn.  Avoid having throw rugs at the top or bottom of the stairs. If you do have throw rugs, attach them to the floor with carpet tape.  Make sure that you have a light switch at the top of the stairs and the bottom of the stairs. If you do not have them, ask someone to add them for you. What else can I do to help prevent falls?  Wear shoes that:  Do not have high heels.  Have rubber bottoms.  Are comfortable and fit you well.  Are closed at the toe. Do not wear sandals.  If  you use a stepladder:  Make sure that it is fully opened. Do not climb a closed stepladder.  Make sure that both sides of the stepladder are locked into place.  Ask someone to hold it for you, if possible.  Clearly mark and make sure that you can see:  Any grab bars or handrails.  First and last steps.  Where the edge of each step is.  Use tools that help you move around (mobility aids) if they are needed. These include:  Canes.  Walkers.  Scooters.  Crutches.  Turn on the lights when you go into a dark area. Replace any light bulbs as soon as they burn out.  Set up your furniture so you have a clear path. Avoid moving your furniture around.  If any of your floors are uneven, fix them.  If there are any pets around you, be aware of where they are.  Review your medicines with your doctor. Some medicines can make you feel dizzy. This can increase your chance of falling. Ask your doctor what other things that you can do to help prevent falls. This information is not intended to replace advice given to you by your health care provider. Make sure you discuss any questions you have with your health care provider. Document Released: 03/21/2009 Document Revised: 10/31/2015 Document Reviewed: 06/29/2014 Elsevier Interactive Patient Education  2017 [employer].

## 2017-02-10 NOTE — Progress Notes (Signed)
Subjective:   Jeanne Webb is a 66 y.o. female who presents for Medicare Annual (Subsequent) preventive examination.  Last AWV-01/24/2016    Objective:     Vitals: BP 118/72 (BP Location: Left Arm, Patient Position: Sitting)   Pulse 68   Temp 97.6 F (36.4 C) (Oral)   Ht 5\' 5"  (1.651 m)   Wt 111 lb (50.3 kg)   SpO2 98%   BMI 18.47 kg/m   Body mass index is 18.47 kg/m.   Tobacco History  Smoking Status  . Former Smoker  . Years: 8.00  . Types: Cigarettes  Smokeless Tobacco  . Never Used     Counseling given: Not Answered   Past Medical History:  Diagnosis Date  . Allergy   . Cramp of limb   . Plantar fascial fibromatosis    No past surgical history on file. Family History  Problem Relation Age of Onset  . Cancer Mother        leukemia, ovarian, breast  . Stroke Father   . Heart disease Brother    History  Sexual Activity  . Sexual activity: Not on file    Outpatient Encounter Prescriptions as of 02/10/2017  Medication Sig  . Ascorbic Acid (VITAMIN C PO) Take 2,000 mg by mouth daily.  Marland Kitchen b complex vitamins tablet Take 1 tablet by mouth daily.  . Calcium-Magnesium-Zinc 333-133-5 MG TABS Take two tablets in the morning and two tablets at lunch time  . cholecalciferol (VITAMIN D) 1000 UNITS tablet Take 1,000 Units by mouth 3 (three) times daily.  . magnesium 30 MG tablet Take one tablet twice daily  . Nutritional Supplements (JUICE PLUS FIBRE PO) Take by mouth. 2 capsules by mouth once daily  . vitamin E 400 UNIT capsule Take one tablet once daily for supplement  . [DISCONTINUED] Glucosamine-Chondroitin 1500-1200 MG/30ML LIQD Take by mouth daily.  . [DISCONTINUED] oseltamivir (TAMIFLU) 75 MG capsule Take 1 capsule (75 mg total) by mouth daily.   No facility-administered encounter medications on file as of 02/10/2017.     Activities of Daily Living In your present state of health, do you have any difficulty performing the following activities: 02/10/2017    Hearing? N  Vision? N  Difficulty concentrating or making decisions? N  Walking or climbing stairs? N  Dressing or bathing? N  Doing errands, shopping? N  Preparing Food and eating ? N  Using the Toilet? N  In the past six months, have you accidently leaked urine? Y  Comment wears pads, stress incontinence  Do you have problems with loss of bowel control? N  Managing your Medications? Y  Managing your Finances? Y  Housekeeping or managing your Housekeeping? Y  Some recent data might be hidden    Patient Care Team: Gayland Curry, DO as PCP - General (Geriatric Medicine) Gayland Curry, DO (Geriatric Medicine) Jari Pigg, MD as Consulting Physician (Dermatology)    Assessment:     Exercise Activities and Dietary recommendations Current Exercise Habits: Home exercise routine, Type of exercise: strength training/weights, Time (Minutes): 30, Frequency (Times/Week): 7, Weekly Exercise (Minutes/Week): 210, Intensity: Mild, Exercise limited by: None identified  Goals    . Sit cross legged          Patient would like to be able to sit cross legged, patient will work on flexibility      Fall Risk Fall Risk  02/10/2017 01/24/2016 10/01/2015 01/11/2015 01/04/2014  Falls in the past year? Yes No No No No  Number  falls in past yr: 1 - - - -  Injury with Fall? No - - - -   Depression Screen PHQ 2/9 Scores 02/10/2017 01/24/2016 10/01/2015 01/11/2015  PHQ - 2 Score 0 0 0 0     Cognitive Function MMSE - Mini Mental State Exam 02/10/2017 01/24/2016 01/11/2015  Orientation to time 5 5 5   Orientation to Place 5 5 5   Registration 3 3 3   Attention/ Calculation 5 5 5   Recall 3 3 3   Language- name 2 objects 2 2 2   Language- repeat 1 1 1   Language- follow 3 step command 3 3 3   Language- read & follow direction 1 1 1   Write a sentence 1 1 1   Copy design 1 1 1   Total score 30 30 30          There is no immunization history on file for this patient. Screening Tests Health Maintenance   Topic Date Due  . Hepatitis C Screening  1950/12/17  . COLONOSCOPY  11/29/1998  . MAMMOGRAM  02/20/2018  . DEXA SCAN  Completed      Plan:    I have personally reviewed and addressed the Medicare Annual Wellness questionnaire and have noted the following in the patient's chart:  A. Medical and social history B. Use of alcohol, tobacco or illicit drugs  C. Current medications and supplements D. Functional ability and status E.  Nutritional status F.  Physical activity G. Advance directives H. List of other physicians I.  Hospitalizations, surgeries, and ER visits in previous 12 months J.  [place] to include hearing, vision, cognitive, depression L. Referrals and appointments - none  In addition, I have reviewed and discussed with patient certain preventive protocols, quality metrics, and best practice recommendations. A written personalized care plan for preventive services as well as general preventive health recommendations were provided to patient.  See attached scanned questionnaire for additional information.   Signed,   Rich Reining, RN Nurse Health Advisor   Quick Notes   Health Maintenance: Declined PNA, shingles, tdap and flu vaccines. Colorguard information filled out.      Abnormal Screen: MMSE 30/30, passed clock drawing     Patient Concerns: hip flexibility, went over ways to increase mobility.     Nurse Concerns: none

## 2017-02-11 ENCOUNTER — Ambulatory Visit: Payer: Self-pay

## 2017-02-11 ENCOUNTER — Other Ambulatory Visit: Payer: Self-pay

## 2017-02-11 LAB — CBC WITH DIFFERENTIAL/PLATELET
Basophils Absolute: 80 cells/uL (ref 0–200)
Basophils Relative: 1.9 %
Eosinophils Absolute: 71 cells/uL (ref 15–500)
Eosinophils Relative: 1.7 %
HCT: 39.1 % (ref 35.0–45.0)
Hemoglobin: 13.3 g/dL (ref 11.7–15.5)
Lymphs Abs: 1407 cells/uL (ref 850–3900)
MCH: 30.9 pg (ref 27.0–33.0)
MCHC: 34 g/dL (ref 32.0–36.0)
MCV: 90.9 fL (ref 80.0–100.0)
MPV: 11.3 fL (ref 7.5–12.5)
Monocytes Relative: 6.1 %
Neutro Abs: 2386 cells/uL (ref 1500–7800)
Neutrophils Relative %: 56.8 %
Platelets: 251 10*3/uL (ref 140–400)
RBC: 4.3 10*6/uL (ref 3.80–5.10)
RDW: 11.7 % (ref 11.0–15.0)
Total Lymphocyte: 33.5 %
WBC mixed population: 256 cells/uL (ref 200–950)
WBC: 4.2 10*3/uL (ref 3.8–10.8)

## 2017-02-11 LAB — COMPREHENSIVE METABOLIC PANEL
AG Ratio: 2 (calc) (ref 1.0–2.5)
ALT: 18 U/L (ref 6–29)
AST: 21 U/L (ref 10–35)
Albumin: 4.3 g/dL (ref 3.6–5.1)
Alkaline phosphatase (APISO): 63 U/L (ref 33–130)
BUN: 11 mg/dL (ref 7–25)
CO2: 27 mmol/L (ref 20–32)
Calcium: 9.4 mg/dL (ref 8.6–10.4)
Chloride: 100 mmol/L (ref 98–110)
Creat: 0.58 mg/dL (ref 0.50–0.99)
Globulin: 2.2 g/dL (calc) (ref 1.9–3.7)
Glucose, Bld: 91 mg/dL (ref 65–99)
Potassium: 4.3 mmol/L (ref 3.5–5.3)
Sodium: 140 mmol/L (ref 135–146)
Total Bilirubin: 0.5 mg/dL (ref 0.2–1.2)
Total Protein: 6.5 g/dL (ref 6.1–8.1)

## 2017-02-11 LAB — LIPID PANEL
Cholesterol: 218 mg/dL — ABNORMAL HIGH (ref <200)
HDL: 104 mg/dL (ref >50)
LDL Cholesterol (Calc): 99 mg/dL (calc)
Non-HDL Cholesterol (Calc): 114 mg/dL (calc) (ref <130)
Total CHOL/HDL Ratio: 2.1 (calc) (ref <5.0)
Triglycerides: 61 mg/dL (ref <150)

## 2017-02-11 LAB — HEPATITIS C ANTIBODY
Hepatitis C Ab: NONREACTIVE
SIGNAL TO CUT-OFF: 0.01 (ref <1.00)

## 2017-02-12 ENCOUNTER — Ambulatory Visit: Payer: Self-pay | Admitting: Internal Medicine

## 2017-02-15 ENCOUNTER — Encounter: Payer: Self-pay | Admitting: Internal Medicine

## 2017-02-15 ENCOUNTER — Ambulatory Visit (INDEPENDENT_AMBULATORY_CARE_PROVIDER_SITE_OTHER): Payer: Medicare Other | Admitting: Internal Medicine

## 2017-02-15 VITALS — BP 128/70 | HR 71 | Ht 65.0 in | Wt 111.0 lb

## 2017-02-15 DIAGNOSIS — M7740 Metatarsalgia, unspecified foot: Secondary | ICD-10-CM

## 2017-02-15 DIAGNOSIS — M1612 Unilateral primary osteoarthritis, left hip: Secondary | ICD-10-CM

## 2017-02-15 DIAGNOSIS — E785 Hyperlipidemia, unspecified: Secondary | ICD-10-CM | POA: Diagnosis not present

## 2017-02-15 NOTE — Patient Instructions (Addendum)
I encourage you to follow through with the cologuard screening.  Keep up the good work with your health!

## 2017-02-15 NOTE — Progress Notes (Signed)
Location:  [facility] clinic Provider:  Seretha Estabrooks L. Mariea Clonts, D.O., C.M.D.  Code Status: DNR Goals of Care:  Advanced Directives 02/15/2017  Does Patient Have a Medical Advance Directive? Yes  Type of Paramedic of [place];Living will  Does patient want to make changes to medical advance directive? -  Copy of [place] in Chart? No - copy requested  Would patient like information on creating a medical advance directive? -   Chief Complaint  Patient presents with  . Medical Management of Chronic Issues    follow-up after medicare wellness visit    HPI: Patient is a 66 y.o. female seen today for medical management of chronic diseases.    Actually agreed to cologuard screening with Clarise Cruz at [facility]  Refuses all vaccines. C/o difficulty with hip flexibity and Clarise Cruz reviewed mobility exercises with her. She refused her vitals, saying everything being done is redundant from the AWV.  This was meant to be an exam today.  She did eventually agree after our manager spoke with her. Labs were reviewed.  Blood counts are normal, electrolytes, liver and kidneys are normal.   LDL at goal at 99.  HDL is 104 which is protective. Hepatitis C screen was nonreactive.    She went to Ruby Nockam at the gym who does massage and neurokinetic therapy. She is doing exercises from her on her left hip--pain free going upstairs.  Psoas and gluteus medius were not working.  She can feel those now.  Still plans to work on ROM exercises b/c ROM is lost.    Added squats, planks, and pushups to her routine.  She stopped planks and doing breathing exercises first.    Plantar fasciitis doing better with cookies and heel support.    Taking the calcium in the evening instead.    Had a fall when she got knocked down boogie boarding, also when toting a lot of items.  Does her tai chi and balance improves.    Past Medical History:  Diagnosis Date  . Allergy   . Cramp of limb   .  Plantar fascial fibromatosis     No past surgical history on file.  Allergies  Allergen Reactions  . Lactose Intolerance (Gi)   . Penicillins   . Seldane [Terfenadine]   . Water Oral [Alimentum]     Allergies as of 02/15/2017      Reactions   Lactose Intolerance (gi)    Penicillins    Seldane [terfenadine]    Water Oral [alimentum]       Medication List       Accurate as of 02/15/17 10:47 AM. Always use your most recent med list.          b complex vitamins tablet Take 1 tablet by mouth daily.   Calcium-Magnesium-Zinc 333-133-5 MG Tabs Take two tablets in the morning and two tablets at lunch time   cholecalciferol 1000 units tablet Commonly known as:  VITAMIN D Take 1,000 Units by mouth 3 (three) times daily.   JUICE PLUS FIBRE PO Take by mouth. 2 capsules by mouth once daily   magnesium 30 MG tablet Take one tablet twice daily   VITAMIN C PO Take 2,000 mg by mouth daily.   vitamin E 400 UNIT capsule Take one tablet once daily for supplement            Discharge Care Instructions        Start     Ordered   02/15/17  0000  CBC with Differential/Platelet     02/15/17 1041   02/15/17 0000  COMPLETE METABOLIC PANEL WITH GFR     02/15/17 1041   02/15/17 0000  Lipid panel     02/15/17 1041      Review of Systems:  Review of Systems  Constitutional: Negative for chills, fever and malaise/fatigue.  HENT: Negative for congestion and hearing loss.   Eyes: Negative for blurred vision.       Glasses  Respiratory: Negative for cough and shortness of breath.   Cardiovascular: Negative for chest pain, palpitations and leg swelling.  Gastrointestinal: Negative for abdominal pain, blood in stool, constipation and melena.  Genitourinary: Negative for dysuria.       Some stress incontinence, uses pad  Musculoskeletal: Positive for falls and joint pain.       Decreased ROM left hip (external rotation)  Skin: Negative for itching and rash.  Neurological:  Negative for dizziness, loss of consciousness and weakness.  Psychiatric/Behavioral: Negative for depression and memory loss.    Health Maintenance  Topic Date Due  . COLONOSCOPY  11/29/1998  . MAMMOGRAM  02/20/2018  . DEXA SCAN  Completed  . Hepatitis C Screening  Completed    Physical Exam: Vitals:   02/15/17 1006  BP: 128/70  Pulse: 71  SpO2: 99%  Weight: 111 lb (50.3 kg)  Height: 5' 5" (1.651 m)   Body mass index is 18.47 kg/m. Physical Exam  Constitutional: She is oriented to person, place, and time. She appears well-developed and well-nourished. No distress.  HENT:  Head: Normocephalic and atraumatic.  Right Ear: External ear normal.  Left Ear: External ear normal.  Nose: Nose normal.  Mouth/Throat: Oropharynx is clear and moist. No oropharyngeal exudate.  Eyes: Pupils are equal, round, and reactive to light. Conjunctivae and EOM are normal. No scleral icterus.  Neck: Normal range of motion. Neck supple. No JVD present. No tracheal deviation present. No thyromegaly present.  Cardiovascular: Normal rate, regular rhythm, normal heart sounds and intact distal pulses.   No murmur heard. Pulmonary/Chest: Effort normal and breath sounds normal. No respiratory distress.  Abdominal: Soft. Bowel sounds are normal. She exhibits no distension. There is no tenderness.  Musculoskeletal: She exhibits no edema, tenderness or deformity.  Decreased external rotation left hip; callous on toe, hammertoes  Lymphadenopathy:    She has no cervical adenopathy.  Neurological: She is alert and oriented to person, place, and time. No cranial nerve deficit.  Skin: Skin is warm and dry. Capillary refill takes less than 2 seconds.  Psychiatric: She has a normal mood and affect.    Labs reviewed: Basic Metabolic Panel:  Recent Labs  02/10/17 0832  NA 140  K 4.3  CL 100  CO2 27  GLUCOSE 91  BUN 11  CREATININE 0.58  CALCIUM 9.4   Liver Function Tests:  Recent Labs   02/10/17 0832  AST 21  ALT 18  BILITOT 0.5  PROT 6.5   No results for input(s): LIPASE, AMYLASE in the last 8760 hours. No results for input(s): AMMONIA in the last 8760 hours. CBC:  Recent Labs  02/10/17 0832  WBC 4.2  NEUTROABS 2,386  HGB 13.3  HCT 39.1  MCV 90.9  PLT 251   Lipid Panel:  Recent Labs  02/10/17 0832  CHOL 218*  HDL 104  TRIG 61  CHOLHDL 2.1   Assessment/Plan 1. Osteoarthritis of left hip, unspecified osteoarthritis type -limited ROM persists, but no longer painful after doing exercises for  psoas and gluteus medius  2. Hyperlipidemia, unspecified hyperlipidemia type - lipids at goal, cont diet and exercise - CBC with Differential/Platelet; Future - COMPLETE METABOLIC PANEL WITH GFR; Future - Lipid panel; Future  3. Metatarsalgia, unspecified laterality -better with supports in shoes  Labs/tests ordered:   Orders Placed This Encounter  Procedures  . CBC with Differential/Platelet    Standing Status:   Future    Standing Expiration Date:   02/15/2021  . COMPLETE METABOLIC PANEL WITH GFR    Standing Status:   Future    Standing Expiration Date:   02/15/2021  . Lipid panel    Standing Status:   Future    Standing Expiration Date:   02/15/2021    Next appt:  2 years with labs before, mammogram in between  [place]. Reed, D.O. [facility] [address], [place] [address] Cell Phone (Mon-Fri 8am-5pm):  [phone] On Call:  [phone] & follow prompts after 5pm & weekends Office Phone:  [phone] Office Fax:  [phone]

## 2017-07-20 ENCOUNTER — Telehealth: Payer: Self-pay | Admitting: *Deleted

## 2017-07-20 NOTE — Telephone Encounter (Signed)
Patient walked into the office and stated that she had some oral surgery done and wanted the antibiotic that Dr. Chelsea Aus [phone] placed her on in her chart.  Patient also stated that she is prone to getting infections after surgery and stated that her ear has started hurting. Appointment scheduled for in the morning for patient to be evaluated with Janett Billow. Patient aware.  Medication added to list.

## 2017-07-21 ENCOUNTER — Ambulatory Visit (INDEPENDENT_AMBULATORY_CARE_PROVIDER_SITE_OTHER): Payer: Medicare Other | Admitting: Nurse Practitioner

## 2017-07-21 ENCOUNTER — Encounter: Payer: Self-pay | Admitting: Nurse Practitioner

## 2017-07-21 VITALS — BP 122/82 | HR 63 | Temp 98.1°F | Ht 65.0 in | Wt 111.0 lb

## 2017-07-21 DIAGNOSIS — H6690 Otitis media, unspecified, unspecified ear: Secondary | ICD-10-CM

## 2017-07-21 DIAGNOSIS — Z9889 Other specified postprocedural states: Secondary | ICD-10-CM

## 2017-07-21 NOTE — Progress Notes (Signed)
Careteam: Patient Care Team: Gayland Curry, DO as PCP - General (Geriatric Medicine) Gayland Curry, DO (Geriatric Medicine) Jari Pigg, MD as Consulting Physician (Dermatology)  Advanced Directive information    Allergies  Allergen Reactions  . Lactose Intolerance (Gi)   . Penicillins   . Seldane [Terfenadine]   . Water Oral [Alimentum]     Chief Complaint  Patient presents with  . Acute Visit    Pt is being seen due to left ear pain. Pt had oral surgery on 07/09/17 and saw surgeon yesterday. No oral infections.      HPI: Patient is a 67 y.o. female seen in the office today for left ear fullness x3 days.  Recent oral surgery on 07/09/17 to the left lower quadrant of mouth.  Pt started on ten days course of Omnicef on 07/20/17 by her oral surgeon.  Pt is taking and tolerating Omnicef.  Pt endorsed feeling poorly and having chills on 07/19/17 which prompted her to check her temperature and it was 97 degrees F.  Denies pain or discharge to left ear.  Pt is taking and tolerating Omnicef.  States "I feel okay today, but I just wanted my primary care provider to be aware of what happened".  Notes having oral surgery few years ago and ended up with an ear infection then.  Voiced no other concerns today.    Review of Systems:  Review of Systems  Constitutional: Positive for chills. Negative for fever.       C/o chills on Monday night, temperature was 97 degrees F per pt  HENT: Positive for congestion.        I am still having feelings of fullness in my left ear.  No pain  Respiratory: Negative.   Gastrointestinal: Negative for abdominal pain, diarrhea, nausea and vomiting.  Neurological: Negative for dizziness, weakness and headaches.    Past Medical History:  Diagnosis Date  . Allergy   . Cramp of limb   . Plantar fascial fibromatosis    History reviewed. No pertinent surgical history. Social History:   reports that she has quit smoking. Her smoking use included  cigarettes. She quit after 8.00 years of use. she has never used smokeless tobacco. She reports that she does not drink alcohol or use drugs.  Family History  Problem Relation Age of Onset  . Cancer Mother        leukemia, ovarian, breast  . Stroke Father   . Heart disease Brother     Medications: Patient's Medications  New Prescriptions   No medications on file  Previous Medications   ASCORBIC ACID (VITAMIN C PO)    Take 2,000 mg by mouth daily.   B COMPLEX VITAMINS TABLET    Take 1 tablet by mouth daily.   CALCIUM-MAGNESIUM-ZINC 333-133-5 MG TABS    Take two tablets in the morning and two tablets at lunch time   CEFDINIR (OMNICEF) 300 MG CAPSULE    Take 300 mg by mouth 2 (two) times daily. For 10 days due to oral surgery. (Dr. Chelsea Aus prescribed)   CHOLECALCIFEROL (VITAMIN D) 1000 UNITS TABLET    Take 1,000 Units by mouth 3 (three) times daily.   MAGNESIUM 30 MG TABLET    Take one tablet twice daily   NUTRITIONAL SUPPLEMENTS (JUICE PLUS FIBRE PO)    Take by mouth. 2 capsules by mouth once daily   VITAMIN E 400 UNIT CAPSULE    Take one tablet once daily for supplement  Modified Medications   No medications on file  Discontinued Medications   No medications on file     Physical Exam:  Vitals:   07/21/17 1007  BP: 122/82  Pulse: 63  Temp: 98.1 F (36.7 C)  TempSrc: Oral  SpO2: 98%  Weight: 111 lb (50.3 kg)  Height: 5\' 5"  (1.651 m)   Body mass index is 18.47 kg/m.  Physical Exam  Constitutional: She is oriented to person, place, and time. She appears well-developed.  HENT:  Head: Normocephalic and atraumatic.  Right Ear: Hearing, tympanic membrane, external ear and ear canal normal.  Left Ear: Hearing, tympanic membrane, external ear and ear canal normal.  Nose: Nose normal. No mucosal edema or rhinorrhea. Right sinus exhibits no maxillary sinus tenderness and no frontal sinus tenderness. Left sinus exhibits no maxillary sinus tenderness and no frontal sinus  tenderness.  Mouth/Throat: Oropharynx is clear and moist and mucous membranes are normal.    Eyes: Pupils are equal, round, and reactive to light.  Neck: Normal range of motion. Neck supple.  Cardiovascular: Normal rate, regular rhythm and normal heart sounds.  Pulmonary/Chest: Breath sounds normal.  Abdominal: Soft. Bowel sounds are normal. She exhibits no distension. There is no tenderness.  Neurological: She is alert and oriented to person, place, and time.  Skin: Skin is warm and dry.  Psychiatric: She has a normal mood and affect. Her behavior is normal. Judgment and thought content normal.  Nursing note and vitals reviewed.   Labs reviewed: Basic Metabolic Panel: Recent Labs    02/10/17 0832  NA 140  K 4.3  CL 100  CO2 27  GLUCOSE 91  BUN 11  CREATININE 0.58  CALCIUM 9.4   Liver Function Tests: Recent Labs    02/10/17 0832  AST 21  ALT 18  BILITOT 0.5  PROT 6.5   No results for input(s): LIPASE, AMYLASE in the last 8760 hours. No results for input(s): AMMONIA in the last 8760 hours. CBC: Recent Labs    02/10/17 0832  WBC 4.2  NEUTROABS 2,386  HGB 13.3  HCT 39.1  MCV 90.9  PLT 251   Lipid Panel: Recent Labs    02/10/17 0832  CHOL 218*  HDL 104  TRIG 61  CHOLHDL 2.1   TSH: No results for input(s): TSH in the last 8760 hours. A1C: No results found for: HGBA1C   Assessment/Plan 1. Acute otitis media, unspecified otitis media type Improved Finish full course of Omnicef as prescribed by your oral surgeon Continue to take [place] with yogurt Continue to stay hydrated Follow up for any worsening or worrisome concerns   2. H/O oral surgery Healing: recent oral surgery to left lower quadrant of mouth; saw oral surgery yesterday and has appt appointment with oral surgeon on 07/22/17    Carlos American. Harle Battiest  [facility] [phone] 8 am - 5 pm) [phone] (after hours)

## 2017-08-27 DIAGNOSIS — M1612 Unilateral primary osteoarthritis, left hip: Secondary | ICD-10-CM | POA: Diagnosis not present

## 2017-08-27 DIAGNOSIS — M25552 Pain in left hip: Secondary | ICD-10-CM | POA: Diagnosis not present

## 2017-10-15 ENCOUNTER — Encounter: Payer: Self-pay | Admitting: Nurse Practitioner

## 2017-10-15 ENCOUNTER — Ambulatory Visit (INDEPENDENT_AMBULATORY_CARE_PROVIDER_SITE_OTHER): Payer: Medicare Other | Admitting: Nurse Practitioner

## 2017-10-15 VITALS — BP 120/70 | HR 74 | Temp 97.7°F | Ht 65.0 in | Wt 111.0 lb

## 2017-10-15 DIAGNOSIS — Z01818 Encounter for other preprocedural examination: Secondary | ICD-10-CM

## 2017-10-15 DIAGNOSIS — M1612 Unilateral primary osteoarthritis, left hip: Secondary | ICD-10-CM

## 2017-10-15 NOTE — Progress Notes (Signed)
Careteam: Patient Care Team: Gayland Curry, DO as PCP - General (Geriatric Medicine) Gayland Curry, DO (Geriatric Medicine) Jari Pigg, MD as Consulting Physician (Dermatology)  Advanced Directive information    Allergies  Allergen Reactions  . Lactose Intolerance (Gi)   . Penicillins   . Seldane [Terfenadine]   . Water Oral [Alimentum]     Chief Complaint  Patient presents with  . pre-op visit     HPI: Patient is a 67 y.o. female seen in the office today for pre-op visit.  Plans to have Left total hip on 11/09/17 by Dr Alvan Dame. Thought she could exercise her hip pain away. Also has done massage but this has not helped. Not taken any medication but decreased ROM. Pain and decrease in ROM for many years. Had an episode of where her hip locked up and worried she will hurt herself.  Needing visit by PCP today. Has never had any major surgery in the past.  Not on any prescription medication. Reports she does not do medication well. Goes to a homeopathic doctor in [place], [place].  No hx or current chest pains. No history of heart disease.  No shortness of breath cough or congestion, no problems with breathing.  Multiple allergies. Very sensitive to multiple things. "allergic to the earth"  Reports she has had the homeopathic doctor test her for everything they plan on putting in her body and has been cleared by her.      Review of Systems:  Review of Systems  Constitutional: Negative for chills, fever, malaise/fatigue and weight loss.  HENT: Negative for congestion and hearing loss.   Eyes: Negative for blurred vision.       Glasses  Respiratory: Negative for cough, sputum production and shortness of breath.   Cardiovascular: Negative for chest pain, palpitations and leg swelling.  Gastrointestinal: Negative for abdominal pain, blood in stool, constipation and melena.  Genitourinary: Negative for dysuria, frequency, hematuria and urgency.       Some stress incontinence,  uses pad  Musculoskeletal: Positive for falls and joint pain.       Decreased ROM left hip (external rotation)  Skin: Negative for itching and rash.  Neurological: Negative for dizziness, weakness and headaches.    Past Medical History:  Diagnosis Date  . Allergy   . Cramp of limb   . Plantar fascial fibromatosis    No past surgical history on file. Social History:   reports that she has quit smoking. Her smoking use included cigarettes. She quit after 8.00 years of use. She has never used smokeless tobacco. She reports that she does not drink alcohol or use drugs.  Family History  Problem Relation Age of Onset  . Cancer Mother        leukemia, ovarian, breast  . Stroke Father   . Heart disease Brother     Medications: Patient's Medications  New Prescriptions   No medications on file  Previous Medications   ASCORBIC ACID (VITAMIN C PO)    Take 2,000 mg by mouth daily.   B COMPLEX VITAMINS TABLET    Take 1 tablet by mouth daily.   CALCIUM-MAGNESIUM-ZINC 333-133-5 MG TABS    Take two tablets in the morning and two tablets at lunch time   CHOLECALCIFEROL (VITAMIN D) 1000 UNITS TABLET    Take 10,000 Units by mouth 3 (three) times daily.    MAGNESIUM 30 MG TABLET    Take one tablet twice daily   NUTRITIONAL SUPPLEMENTS (JUICE PLUS  FIBRE PO)    Take by mouth. 2 capsules by mouth once daily   VITAMIN E 400 UNIT CAPSULE    Take one tablet once daily for supplement  Modified Medications   No medications on file  Discontinued Medications   CEFDINIR (OMNICEF) 300 MG CAPSULE    Take 300 mg by mouth 2 (two) times daily. For 10 days due to oral surgery. (Dr. Chelsea Aus prescribed)     Physical Exam:  Vitals:   10/15/17 1026  BP: 120/70  Pulse: 74  Temp: 97.7 F (36.5 C)  TempSrc: Oral  SpO2: 98%  Weight: 111 lb (50.3 kg)  Height: 5\' 5"  (1.651 m)   Body mass index is 18.47 kg/m.  Physical Exam  Constitutional: She is oriented to person, place, and time. She appears  well-developed and well-nourished. No distress.  HENT:  Head: Normocephalic and atraumatic.  Right Ear: External ear normal.  Left Ear: External ear normal.  Nose: Nose normal.  Mouth/Throat: Oropharynx is clear and moist. No oropharyngeal exudate.  Eyes: Pupils are equal, round, and reactive to light. Conjunctivae and EOM are normal. No scleral icterus.  Neck: Normal range of motion. Neck supple. No JVD present. No tracheal deviation present. No thyromegaly present.  Cardiovascular: Normal rate, regular rhythm, normal heart sounds and intact distal pulses.  No murmur heard. Pulmonary/Chest: Effort normal and breath sounds normal. No respiratory distress.  Abdominal: Soft. Bowel sounds are normal. She exhibits no distension. There is no tenderness.  Musculoskeletal: She exhibits no edema, tenderness or deformity.  Lymphadenopathy:    She has no cervical adenopathy.  Neurological: She is alert and oriented to person, place, and time. No cranial nerve deficit.  Skin: Skin is warm and dry. Capillary refill takes less than 2 seconds.  Psychiatric: She has a normal mood and affect.    Labs reviewed: Basic Metabolic Panel: Recent Labs    02/10/17 0832  NA 140  K 4.3  CL 100  CO2 27  GLUCOSE 91  BUN 11  CREATININE 0.58  CALCIUM 9.4   Liver Function Tests: Recent Labs    02/10/17 0832  AST 21  ALT 18  BILITOT 0.5  PROT 6.5   No results for input(s): LIPASE, AMYLASE in the last 8760 hours. No results for input(s): AMMONIA in the last 8760 hours. CBC: Recent Labs    02/10/17 0832  WBC 4.2  NEUTROABS 2,386  HGB 13.3  HCT 39.1  MCV 90.9  PLT 251   Lipid Panel: Recent Labs    02/10/17 0832  CHOL 218*  HDL 104  LDLCALC 99  TRIG 61  CHOLHDL 2.1   TSH: No results for input(s): TSH in the last 8760 hours. A1C: No results found for: HGBA1C   Assessment/Plan 1. Pre-op exam -low risk status for left total hip arthroplasty. Pt reports she has increased her  activity and tried to gain strength to help her after surgery. Discussed constipation may be an issues post-op, to stay well hydrated and she plans to talk to her homeopathic specialist abt this as well.  -lab work pending but once complete will send with form back to orthopedic to precede with surgery.  - EKG 12-Lead- normal sinus, rate of 65 - CBC with Differential/Platelets - CMP - Hemoglobin A1c - Urinalysis  2. Osteoarthritis of left hip, unspecified osteoarthritis type -pt with increased pain planning left total hip arthroplasty by Dr Alvan Dame.  Next appt:  Carlos American. [facility]  [phone]   

## 2017-10-16 LAB — CBC WITH DIFFERENTIAL/PLATELET
BASOS PCT: 1.3 %
Basophils Absolute: 69 cells/uL (ref 0–200)
EOS ABS: 69 {cells}/uL (ref 15–500)
Eosinophils Relative: 1.3 %
HEMATOCRIT: 40.5 % (ref 35.0–45.0)
HEMOGLOBIN: 13.8 g/dL (ref 11.7–15.5)
Lymphs Abs: 1733 cells/uL (ref 850–3900)
MCH: 30.8 pg (ref 27.0–33.0)
MCHC: 34.1 g/dL (ref 32.0–36.0)
MCV: 90.4 fL (ref 80.0–100.0)
MPV: 10.5 fL (ref 7.5–12.5)
Monocytes Relative: 5.9 %
Neutro Abs: 3116 cells/uL (ref 1500–7800)
Neutrophils Relative %: 58.8 %
PLATELETS: 308 10*3/uL (ref 140–400)
RBC: 4.48 10*6/uL (ref 3.80–5.10)
RDW: 11.5 % (ref 11.0–15.0)
TOTAL LYMPHOCYTE: 32.7 %
WBC: 5.3 10*3/uL (ref 3.8–10.8)
WBCMIX: 313 {cells}/uL (ref 200–950)

## 2017-10-16 LAB — URINALYSIS W MICROSCOPIC + REFLEX CULTURE
Bacteria, UA: NONE SEEN /HPF
Bilirubin Urine: NEGATIVE
Glucose, UA: NEGATIVE
Hgb urine dipstick: NEGATIVE
Hyaline Cast: NONE SEEN /LPF
Ketones, ur: NEGATIVE
Leukocyte Esterase: NEGATIVE
Nitrites, Initial: NEGATIVE
PROTEIN: NEGATIVE
RBC / HPF: NONE SEEN /HPF (ref 0–2)
SPECIFIC GRAVITY, URINE: 1.007 (ref 1.001–1.03)
SQUAMOUS EPITHELIAL / LPF: NONE SEEN /HPF (ref <=5)
WBC, UA: NONE SEEN /HPF (ref 0–5)
pH: 6.5 (ref 5.0–8.0)

## 2017-10-16 LAB — COMPREHENSIVE METABOLIC PANEL
AG RATIO: 2.1 (calc) (ref 1.0–2.5)
ALKALINE PHOSPHATASE (APISO): 59 U/L (ref 33–130)
ALT: 14 U/L (ref 6–29)
AST: 21 U/L (ref 10–35)
Albumin: 4.5 g/dL (ref 3.6–5.1)
BILIRUBIN TOTAL: 0.5 mg/dL (ref 0.2–1.2)
BUN: 16 mg/dL (ref 7–25)
CALCIUM: 9.4 mg/dL (ref 8.6–10.4)
CHLORIDE: 100 mmol/L (ref 98–110)
CO2: 31 mmol/L (ref 20–32)
Creat: 0.65 mg/dL (ref 0.50–0.99)
GLOBULIN: 2.1 g/dL (ref 1.9–3.7)
Glucose, Bld: 80 mg/dL (ref 65–139)
Potassium: 3.9 mmol/L (ref 3.5–5.3)
Sodium: 139 mmol/L (ref 135–146)
Total Protein: 6.6 g/dL (ref 6.1–8.1)

## 2017-10-16 LAB — NO CULTURE INDICATED

## 2017-10-16 LAB — HEMOGLOBIN A1C
HEMOGLOBIN A1C: 5.3 %{Hb} (ref <5.7)
MEAN PLASMA GLUCOSE: 105 (calc)
eAG (mmol/L): 5.8 (calc)

## 2017-10-18 DIAGNOSIS — M25552 Pain in left hip: Secondary | ICD-10-CM | POA: Diagnosis not present

## 2017-10-18 DIAGNOSIS — M1612 Unilateral primary osteoarthritis, left hip: Secondary | ICD-10-CM | POA: Diagnosis not present

## 2017-10-18 NOTE — H&P (Signed)
TOTAL HIP ADMISSION H&P  Patient is admitted for left total hip arthroplasty, anterior approach.  Subjective:  Chief Complaint:     Left hip primary OA / pain  HPI: Jeanne Webb, 66 y.o. female, has a history of pain and functional disability in the left hip(s) due to arthritis and patient has failed non-surgical conservative treatments for greater than 12 weeks to include NSAID's and/or analgesics and activity modification.  Onset of symptoms was gradual starting 11+ years ago with gradually worsening course since that time.The patient noted no past surgery on the left hip(s).  Patient currently rates pain in the left hip at 10 out of 10 with activity. Patient has worsening of pain with activity and weight bearing, trendelenberg gait, pain that interfers with activities of daily living and pain with passive range of motion. Patient has evidence of periarticular osteophytes and joint space narrowing by imaging studies. This condition presents safety issues increasing the risk of falls.  There is no current active infection.  Risks, benefits and expectations were discussed with the patient.  Risks including but not limited to the risk of anesthesia, blood clots, nerve damage, blood vessel damage, failure of the prosthesis, infection and up to and including death.  Patient understand the risks, benefits and expectations and wishes to proceed with surgery.   PCP: Gayland Curry, DO  D/C Plans:       Home   Post-op Meds:       No Rx given   Tranexamic Acid:      To be given - IV   Decadron:      Is to be given  FYI:      ASA  Norco  DME:   Rx given for - RW   PT:    No PT    Patient Active Problem List   Diagnosis Date Noted  . Hyperlipidemia 09/01/2012  . Osteoarthritis of left hip 09/01/2012  . Metatarsalgia 10/26/2011   Past Medical History:  Diagnosis Date  . Allergy   . Cramp of limb   . Plantar fascial fibromatosis     No past surgical history on file.  No current  facility-administered medications for this encounter.    Current Outpatient Medications  Medication Sig Dispense Refill Last Dose  . Ascorbic Acid (VITAMIN C PO) Take 2,000 mg by mouth daily.   Taking  . b complex vitamins tablet Take 1 tablet by mouth daily.   Taking  . Calcium-Magnesium-Zinc 333-133-5 MG TABS Take two tablets in the morning and two tablets at lunch time   Taking  . cholecalciferol (VITAMIN D) 1000 UNITS tablet Take 10,000 Units by mouth 3 (three) times daily.    Taking  . magnesium 30 MG tablet Take one tablet twice daily   Taking  . Nutritional Supplements (JUICE PLUS FIBRE PO) Take by mouth. 2 capsules by mouth once daily   Taking  . vitamin E 400 UNIT capsule Take one tablet once daily for supplement   Taking   Allergies  Allergen Reactions  . Lactose Intolerance (Gi)   . Penicillins   . Seldane [Terfenadine]   . Water Oral [Alimentum]     Social History   Tobacco Use  . Smoking status: Former Smoker    Years: 8.00    Types: Cigarettes  . Smokeless tobacco: Never Used  Substance Use Topics  . Alcohol use: No    Alcohol/week: 0.0 oz    Family History  Problem Relation Age of Onset  .  Cancer Mother        leukemia, ovarian, breast  . Stroke Father   . Heart disease Brother      Review of Systems  Constitutional: Negative.   HENT: Negative.   Eyes: Negative.   Respiratory: Negative.   Cardiovascular: Negative.   Gastrointestinal: Negative.   Genitourinary: Positive for urgency (stress incontinence).  Musculoskeletal: Positive for joint pain.  Skin: Negative.   Neurological: Negative.   Endo/Heme/Allergies: Positive for environmental allergies.  Psychiatric/Behavioral: Negative.     Objective:  Physical Exam  Constitutional: She is oriented to person, place, and time. She appears well-developed.  HENT:  Head: Normocephalic.  Eyes: Pupils are equal, round, and reactive to light.  Neck: Neck supple. No JVD present. No tracheal deviation  present. No thyromegaly present.  Cardiovascular: Normal rate, regular rhythm and intact distal pulses.  Respiratory: Effort normal and breath sounds normal. No respiratory distress. She has no wheezes.  GI: Soft. There is no tenderness. There is no guarding.  Musculoskeletal:       Left hip: She exhibits decreased range of motion, decreased strength, tenderness and bony tenderness. She exhibits no swelling, no deformity and no laceration.  Lymphadenopathy:    She has no cervical adenopathy.  Neurological: She is alert and oriented to person, place, and time.  Skin: Skin is warm and dry.  Psychiatric: She has a normal mood and affect.      Labs:  Estimated body mass index is 18.47 kg/m as calculated from the following:   Height as of 10/15/17: 5\' 5"  (1.651 m).   Weight as of 10/15/17: 50.3 kg (111 lb).   Imaging Review Plain radiographs demonstrate severe degenerative joint disease of the left hip(s). The bone quality appears to be good for age and reported activity level.    Preoperative templating of the joint replacement has been completed, documented, and submitted to the Operating Room personnel in order to optimize intra-operative equipment management.     Assessment/Plan:  End stage arthritis, left hip  The patient history, physical examination, clinical judgement of the provider and imaging studies are consistent with end stage degenerative joint disease of the left hip and total hip arthroplasty is deemed medically necessary. The treatment options including medical management, injection therapy, arthroscopy and arthroplasty were discussed at length. The risks and benefits of total hip arthroplasty were presented and reviewed. The risks due to aseptic loosening, infection, stiffness, dislocation/subluxation,  thromboembolic complications and other imponderables were discussed.  The patient acknowledged the explanation, agreed to proceed with the plan and consent was signed.  Patient is being admitted for inpatient treatment for surgery, pain control, PT, OT, prophylactic antibiotics, VTE prophylaxis, progressive ambulation and ADL's and discharge planning.The patient is planning to be discharged home.    [place] Liz Pinho   PA-C  10/18/2017, 11:34 AM

## 2017-10-29 ENCOUNTER — Other Ambulatory Visit (HOSPITAL_COMMUNITY): Payer: Self-pay | Admitting: Emergency Medicine

## 2017-10-29 NOTE — Progress Notes (Signed)
LOV/surgical clearance [place], NP 10-15-17 epic   EKG 10-15-17 epic   Cbcdiff, cmp, hgba1c, urinalysis with culture 10-15-17 epic

## 2017-10-29 NOTE — Patient Instructions (Signed)
Jeanne Webb  10/29/2017   Your procedure is scheduled on: 11-09-17   Report to [facility] Main  Entrance    Report to admitting at 9:30AM    Call this number if you have problems the morning of surgery [phone]     Remember: Do not eat food or drink liquids :After Midnight.     Take these medicines the morning of surgery with A SIP OF WATER: none                                  You may not have any metal on your body including hair pins and              piercings  Do not wear jewelry, make-up, lotions, powders or perfumes, deodorant             Do not wear nail polish.  Do not shave  48 hours prior to surgery.     Do not bring valuables to the hospital. [place].  Contacts, dentures or bridgework may not be worn into surgery.  Leave suitcase in the car. After surgery it may be brought to your room.                Please read over the following fact sheets you were given: _____________________________________________________________________             [facility] - Preparing for Surgery Before surgery, you can play an important role.  Because skin is not sterile, your skin needs to be as free of germs as possible.  You can reduce the number of germs on your skin by washing with CHG (chlorahexidine gluconate) soap before surgery.  CHG is an antiseptic cleaner which kills germs and bonds with the skin to continue killing germs even after washing. Please DO NOT use if you have an allergy to CHG or antibacterial soaps.  If your skin becomes reddened/irritated stop using the CHG and inform your nurse when you arrive at Short Stay. Do not shave (including legs and underarms) for at least 48 hours prior to the first CHG shower.  You may shave your face/neck. Please follow these instructions carefully:  1.  Shower with CHG Soap the night before surgery and the  morning of Surgery.  2.  If you choose to  wash your hair, wash your hair first as usual with your  normal  shampoo.  3.  After you shampoo, rinse your hair and body thoroughly to remove the  shampoo.                           4.  Use CHG as you would any other liquid soap.  You can apply chg directly  to the skin and wash                       Gently with a scrungie or clean washcloth.  5.  Apply the CHG Soap to your body ONLY FROM THE NECK DOWN.   Do not use on face/ open  Wound or open sores. Avoid contact with eyes, ears mouth and genitals (private parts).                       Wash face,  Genitals (private parts) with your normal soap.             6.  Wash thoroughly, paying special attention to the area where your surgery  will be performed.  7.  Thoroughly rinse your body with warm water from the neck down.  8.  DO NOT shower/wash with your normal soap after using and rinsing off  the CHG Soap.                9.  Pat yourself dry with a clean towel.            10.  Wear clean pajamas.            11.  Place clean sheets on your bed the night of your first shower and do not  sleep with pets. Day of Surgery : Do not apply any lotions/deodorants the morning of surgery.  Please wear clean clothes to the hospital/surgery center.  FAILURE TO FOLLOW THESE INSTRUCTIONS MAY RESULT IN THE CANCELLATION OF YOUR SURGERY PATIENT SIGNATURE_________________________________  NURSE SIGNATURE__________________________________  ________________________________________________________________________   Adam Phenix  An incentive spirometer is a tool that can help keep your lungs clear and active. This tool measures how well you are filling your lungs with each breath. Taking long deep breaths may help reverse or decrease the chance of developing breathing (pulmonary) problems (especially infection) following:  A long period of time when you are unable to move or be active. BEFORE THE PROCEDURE   If the  spirometer includes an indicator to show your best effort, your nurse or respiratory therapist will set it to a desired goal.  If possible, sit up straight or lean slightly forward. Try not to slouch.  Hold the incentive spirometer in an upright position. INSTRUCTIONS FOR USE  1. Sit on the edge of your bed if possible, or sit up as far as you can in bed or on a chair. 2. Hold the incentive spirometer in an upright position. 3. Breathe out normally. 4. Place the mouthpiece in your mouth and seal your lips tightly around it. 5. Breathe in slowly and as deeply as possible, raising the piston or the ball toward the top of the column. 6. Hold your breath for 3-5 seconds or for as long as possible. Allow the piston or ball to fall to the bottom of the column. 7. Remove the mouthpiece from your mouth and breathe out normally. 8. Rest for a few seconds and repeat Steps 1 through 7 at least 10 times every 1-2 hours when you are awake. Take your time and take a few normal breaths between deep breaths. 9. The spirometer may include an indicator to show your best effort. Use the indicator as a goal to work toward during each repetition. 10. After each set of 10 deep breaths, practice coughing to be sure your lungs are clear. If you have an incision (the cut made at the time of surgery), support your incision when coughing by placing a pillow or rolled up towels firmly against it. Once you are able to get out of bed, walk around indoors and cough well. You may stop using the incentive spirometer when instructed by your caregiver.  RISKS AND COMPLICATIONS  Take your time so you do not get  dizzy or light-headed.  If you are in pain, you may need to take or ask for pain medication before doing incentive spirometry. It is harder to take a deep breath if you are having pain. AFTER USE  Rest and breathe slowly and easily.  It can be helpful to keep track of a log of your progress. Your caregiver can provide  you with a simple table to help with this. If you are using the spirometer at home, follow these instructions: [place] IF:   You are having difficultly using the spirometer.  You have trouble using the spirometer as often as instructed.  Your pain medication is not giving enough relief while using the spirometer.  You develop fever of 100.5 F (38.1 C) or higher. SEEK IMMEDIATE MEDICAL CARE IF:   You cough up bloody sputum that had not been present before.  You develop fever of 102 F (38.9 C) or greater.  You develop worsening pain at or near the incision site. MAKE SURE YOU:   Understand these instructions.  Will watch your condition.  Will get help right away if you are not doing well or get worse. Document Released: 10/05/2006 Document Revised: 08/17/2011 Document Reviewed: 12/06/2006 Exit[employer] Patient Information 2014 [employer], [place].   ________________________________________________________________________  WHAT IS A BLOOD TRANSFUSION? Blood Transfusion Information  A transfusion is the replacement of blood or some of its parts. Blood is made up of multiple cells which provide different functions.  Red blood cells carry oxygen and are used for blood loss replacement.  White blood cells fight against infection.  Platelets control bleeding.  Plasma helps clot blood.  Other blood products are available for specialized needs, such as hemophilia or other clotting disorders. BEFORE THE TRANSFUSION  Who gives blood for transfusions?   Healthy volunteers who are fully evaluated to make sure their blood is safe. This is blood bank blood. Transfusion therapy is the safest it has ever been in the practice of medicine. Before blood is taken from a donor, a complete history is taken to make sure that person has no history of diseases nor engages in risky social behavior (examples are intravenous drug use or sexual activity with multiple partners). The donor's  travel history is screened to minimize risk of transmitting infections, such as malaria. The donated blood is tested for signs of infectious diseases, such as HIV and hepatitis. The blood is then tested to be sure it is compatible with you in order to minimize the chance of a transfusion reaction. If you or a relative donates blood, this is often done in anticipation of surgery and is not appropriate for emergency situations. It takes many days to process the donated blood. RISKS AND COMPLICATIONS Although transfusion therapy is very safe and saves many lives, the main dangers of transfusion include:   Getting an infectious disease.  Developing a transfusion reaction. This is an allergic reaction to something in the blood you were given. Every precaution is taken to prevent this. The decision to have a blood transfusion has been considered carefully by your caregiver before blood is given. Blood is not given unless the benefits outweigh the risks. AFTER THE TRANSFUSION  Right after receiving a blood transfusion, you will usually feel much better and more energetic. This is especially true if your red blood cells have gotten low (anemic). The transfusion raises the level of the red blood cells which carry oxygen, and this usually causes an energy increase.  The nurse administering the transfusion will  monitor you carefully for complications. HOME CARE INSTRUCTIONS  No special instructions are needed after a transfusion. You may find your energy is better. Speak with your caregiver about any limitations on activity for underlying diseases you may have. SEEK MEDICAL CARE IF:   Your condition is not improving after your transfusion.  You develop redness or irritation at the intravenous (IV) site. SEEK IMMEDIATE MEDICAL CARE IF:  Any of the following symptoms occur over the next 12 hours:  Shaking chills.  You have a temperature by mouth above 102 F (38.9 C), not controlled by  medicine.  Chest, back, or muscle pain.  People around you feel you are not acting correctly or are confused.  Shortness of breath or difficulty breathing.  Dizziness and fainting.  You get a rash or develop hives.  You have a decrease in urine output.  Your urine turns a dark color or changes to pink, red, or brown. Any of the following symptoms occur over the next 10 days:  You have a temperature by mouth above 102 F (38.9 C), not controlled by medicine.  Shortness of breath.  Weakness after normal activity.  The white part of the eye turns yellow (jaundice).  You have a decrease in the amount of urine or are urinating less often.  Your urine turns a dark color or changes to pink, red, or brown. Document Released: 05/22/2000 Document Revised: 08/17/2011 Document Reviewed: 01/09/2008 [facility] Patient Information 2014 [place], [place].  _______________________________________________________________________

## 2017-11-02 ENCOUNTER — Encounter (HOSPITAL_COMMUNITY)
Admission: RE | Admit: 2017-11-02 | Discharge: 2017-11-02 | Disposition: A | Discharge status: Home / Self Care | Payer: Medicare Other | Source: Ambulatory Visit | Attending: Orthopedic Surgery | Admitting: Orthopedic Surgery

## 2017-11-02 ENCOUNTER — Other Ambulatory Visit: Payer: Self-pay

## 2017-11-02 ENCOUNTER — Encounter (HOSPITAL_COMMUNITY): Payer: Self-pay

## 2017-11-02 DIAGNOSIS — M1612 Unilateral primary osteoarthritis, left hip: Secondary | ICD-10-CM | POA: Diagnosis not present

## 2017-11-02 DIAGNOSIS — Z01818 Encounter for other preprocedural examination: Secondary | ICD-10-CM | POA: Diagnosis not present

## 2017-11-02 HISTORY — DX: Unspecified osteoarthritis, unspecified site: M19.90

## 2017-11-02 LAB — SURGICAL PCR SCREEN
MRSA, PCR: NEGATIVE
Staphylococcus aureus: NEGATIVE

## 2017-11-02 LAB — ABO/RH: ABO/RH(D): O POS

## 2017-11-05 ENCOUNTER — Other Ambulatory Visit: Payer: Self-pay | Admitting: Orthopedic Surgery

## 2017-11-05 NOTE — Care Plan (Signed)
Ortho Bundle L THA scheduled on 11-09-17 DCP:  Home with spouse.  1 story home with 2 ste.   DME:  No needs.  Rx for RW given prior to surgery.  Has elevated toilets.   PT:  HEP

## 2017-11-08 MED ORDER — TRANEXAMIC ACID 1000 MG/10ML IV SOLN
1000.0000 mg | INTRAVENOUS | Status: AC
  Administered 2017-11-09: 1000 mg via INTRAVENOUS
  Filled 2017-11-08: qty 1100

## 2017-11-09 ENCOUNTER — Inpatient Hospital Stay (HOSPITAL_COMMUNITY): Payer: Medicare Other | Admitting: Anesthesiology

## 2017-11-09 ENCOUNTER — Other Ambulatory Visit: Payer: Self-pay

## 2017-11-09 ENCOUNTER — Inpatient Hospital Stay (HOSPITAL_COMMUNITY): Payer: Medicare Other

## 2017-11-09 ENCOUNTER — Encounter (HOSPITAL_COMMUNITY)
Admission: RE | Disposition: A | Discharge status: Home / Self Care | Payer: Self-pay | Source: Home / Self Care | Attending: Orthopedic Surgery

## 2017-11-09 ENCOUNTER — Encounter (HOSPITAL_COMMUNITY): Payer: Self-pay | Admitting: Emergency Medicine

## 2017-11-09 ENCOUNTER — Inpatient Hospital Stay (HOSPITAL_COMMUNITY)
Admission: RE | Admit: 2017-11-09 | Discharge: 2017-11-10 | DRG: 470 | Disposition: A | Discharge status: Home / Self Care | Payer: Medicare Other | Attending: Orthopedic Surgery | Admitting: Orthopedic Surgery

## 2017-11-09 DIAGNOSIS — E739 Lactose intolerance, unspecified: Secondary | ICD-10-CM | POA: Diagnosis present

## 2017-11-09 DIAGNOSIS — M1712 Unilateral primary osteoarthritis, left knee: Secondary | ICD-10-CM | POA: Diagnosis not present

## 2017-11-09 DIAGNOSIS — Z79899 Other long term (current) drug therapy: Secondary | ICD-10-CM

## 2017-11-09 DIAGNOSIS — Z880 Allergy status to penicillin: Secondary | ICD-10-CM

## 2017-11-09 DIAGNOSIS — Z96642 Presence of left artificial hip joint: Secondary | ICD-10-CM | POA: Diagnosis not present

## 2017-11-09 DIAGNOSIS — M25552 Pain in left hip: Secondary | ICD-10-CM | POA: Diagnosis not present

## 2017-11-09 DIAGNOSIS — Z96649 Presence of unspecified artificial hip joint: Secondary | ICD-10-CM

## 2017-11-09 DIAGNOSIS — E785 Hyperlipidemia, unspecified: Secondary | ICD-10-CM | POA: Diagnosis present

## 2017-11-09 DIAGNOSIS — M1612 Unilateral primary osteoarthritis, left hip: Principal | ICD-10-CM | POA: Diagnosis present

## 2017-11-09 DIAGNOSIS — Z87891 Personal history of nicotine dependence: Secondary | ICD-10-CM | POA: Diagnosis not present

## 2017-11-09 DIAGNOSIS — Z888 Allergy status to other drugs, medicaments and biological substances status: Secondary | ICD-10-CM

## 2017-11-09 DIAGNOSIS — Z471 Aftercare following joint replacement surgery: Secondary | ICD-10-CM | POA: Diagnosis not present

## 2017-11-09 HISTORY — PX: TOTAL HIP ARTHROPLASTY: SHX124

## 2017-11-09 LAB — TYPE AND SCREEN
ABO/RH(D): O POS
ANTIBODY SCREEN: NEGATIVE

## 2017-11-09 SURGERY — ARTHROPLASTY, HIP, TOTAL, ANTERIOR APPROACH
Anesthesia: Spinal | Site: Hip | Laterality: Left

## 2017-11-09 MED ORDER — EPHEDRINE SULFATE-NACL 50-0.9 MG/10ML-% IV SOSY
PREFILLED_SYRINGE | INTRAVENOUS | Status: DC | PRN
  Administered 2017-11-09 (×4): 5 mg via INTRAVENOUS

## 2017-11-09 MED ORDER — PROPOFOL 500 MG/50ML IV EMUL
INTRAVENOUS | Status: DC | PRN
Start: 2017-11-09 — End: 2017-11-09
  Administered 2017-11-09: 50 ug/kg/min via INTRAVENOUS

## 2017-11-09 MED ORDER — METHOCARBAMOL 1000 MG/10ML IJ SOLN
500.0000 mg | Freq: Four times a day (QID) | INTRAVENOUS | Status: DC | PRN
  Filled 2017-11-09: qty 5

## 2017-11-09 MED ORDER — HYDROCODONE-ACETAMINOPHEN 7.5-325 MG PO TABS: 1.0000 | ORAL_TABLET | ORAL | 0 refills | Status: DC | PRN

## 2017-11-09 MED ORDER — MIDAZOLAM HCL 5 MG/5ML IJ SOLN
INTRAMUSCULAR | Status: DC | PRN
  Administered 2017-11-09: 2 mg via INTRAVENOUS

## 2017-11-09 MED ORDER — ONDANSETRON HCL 4 MG/2ML IJ SOLN
INTRAMUSCULAR | Status: DC | PRN
  Administered 2017-11-09: 4 mg via INTRAVENOUS

## 2017-11-09 MED ORDER — ONDANSETRON HCL 4 MG/2ML IJ SOLN
INTRAMUSCULAR | Status: AC
  Filled 2017-11-09: qty 2

## 2017-11-09 MED ORDER — DOCUSATE SODIUM 100 MG PO CAPS
100.0000 mg | ORAL_CAPSULE | Freq: Two times a day (BID) | ORAL | Status: DC
  Filled 2017-11-09 (×2): qty 1

## 2017-11-09 MED ORDER — STERILE WATER FOR IRRIGATION IR SOLN
Status: DC | PRN
  Administered 2017-11-09: 2000 mL

## 2017-11-09 MED ORDER — PHENYLEPHRINE 40 MCG/ML (10ML) SYRINGE FOR IV PUSH (FOR BLOOD PRESSURE SUPPORT)
PREFILLED_SYRINGE | INTRAVENOUS | Status: DC | PRN
  Administered 2017-11-09 (×7): 80 ug via INTRAVENOUS

## 2017-11-09 MED ORDER — SODIUM CHLORIDE 0.9 % IJ SOLN
INTRAMUSCULAR | Status: AC
  Filled 2017-11-09: qty 10

## 2017-11-09 MED ORDER — DIPHENHYDRAMINE HCL 12.5 MG/5ML PO ELIX: 12.5000 mg | ORAL_SOLUTION | ORAL | Status: DC | PRN

## 2017-11-09 MED ORDER — ASPIRIN 81 MG PO CHEW
81.0000 mg | CHEWABLE_TABLET | Freq: Two times a day (BID) | ORAL | Status: DC
  Administered 2017-11-09: 81 mg via ORAL
  Filled 2017-11-09: qty 1

## 2017-11-09 MED ORDER — METOCLOPRAMIDE HCL 5 MG PO TABS: 5.0000 mg | ORAL_TABLET | Freq: Three times a day (TID) | ORAL | Status: DC | PRN

## 2017-11-09 MED ORDER — DOCUSATE SODIUM 100 MG PO CAPS: 100.0000 mg | ORAL_CAPSULE | Freq: Two times a day (BID) | ORAL | 0 refills | Status: DC

## 2017-11-09 MED ORDER — METHOCARBAMOL 500 MG PO TABS
500.0000 mg | ORAL_TABLET | Freq: Four times a day (QID) | ORAL | Status: DC | PRN
  Administered 2017-11-10: 500 mg via ORAL
  Filled 2017-11-09: qty 1

## 2017-11-09 MED ORDER — MENTHOL 3 MG MT LOZG: 1.0000 | LOZENGE | OROMUCOSAL | Status: DC | PRN

## 2017-11-09 MED ORDER — CELECOXIB 200 MG PO CAPS
200.0000 mg | ORAL_CAPSULE | Freq: Two times a day (BID) | ORAL | Status: DC
  Administered 2017-11-09: 200 mg via ORAL
  Filled 2017-11-09 (×2): qty 1

## 2017-11-09 MED ORDER — MIDAZOLAM HCL 2 MG/2ML IJ SOLN
INTRAMUSCULAR | Status: AC
  Filled 2017-11-09: qty 2

## 2017-11-09 MED ORDER — ASPIRIN 81 MG PO CHEW: 81.0000 mg | CHEWABLE_TABLET | Freq: Two times a day (BID) | ORAL | 0 refills | Status: AC

## 2017-11-09 MED ORDER — FENTANYL CITRATE (PF) 100 MCG/2ML IJ SOLN
INTRAMUSCULAR | Status: AC
  Filled 2017-11-09: qty 2

## 2017-11-09 MED ORDER — BISACODYL 10 MG RE SUPP: 10.0000 mg | Freq: Every day | RECTAL | Status: DC | PRN

## 2017-11-09 MED ORDER — METHOCARBAMOL 500 MG PO TABS: 500.0000 mg | ORAL_TABLET | Freq: Four times a day (QID) | ORAL | 0 refills | Status: DC | PRN

## 2017-11-09 MED ORDER — HYDROCODONE-ACETAMINOPHEN 5-325 MG PO TABS
1.0000 | ORAL_TABLET | ORAL | Status: DC | PRN
  Administered 2017-11-09 (×2): 1 via ORAL
  Filled 2017-11-09: qty 2
  Filled 2017-11-09: qty 1

## 2017-11-09 MED ORDER — DEXAMETHASONE SODIUM PHOSPHATE 10 MG/ML IJ SOLN: 10.0000 mg | Freq: Once | INTRAMUSCULAR | Status: DC

## 2017-11-09 MED ORDER — METOCLOPRAMIDE HCL 5 MG/ML IJ SOLN: 5.0000 mg | Freq: Three times a day (TID) | INTRAMUSCULAR | Status: DC | PRN

## 2017-11-09 MED ORDER — EPHEDRINE SULFATE 50 MG/ML IJ SOLN
INTRAMUSCULAR | Status: AC
  Filled 2017-11-09: qty 2

## 2017-11-09 MED ORDER — HYDROMORPHONE HCL 1 MG/ML IJ SOLN: 0.5000 mg | INTRAMUSCULAR | Status: DC | PRN

## 2017-11-09 MED ORDER — TRANEXAMIC ACID 1000 MG/10ML IV SOLN
1000.0000 mg | Freq: Once | INTRAVENOUS | Status: AC
  Administered 2017-11-09: 1000 mg via INTRAVENOUS
  Filled 2017-11-09: qty 1100

## 2017-11-09 MED ORDER — CHLORHEXIDINE GLUCONATE 4 % EX LIQD: 60.0000 mL | Freq: Once | CUTANEOUS | Status: DC

## 2017-11-09 MED ORDER — ONDANSETRON HCL 4 MG PO TABS: 4.0000 mg | ORAL_TABLET | Freq: Four times a day (QID) | ORAL | Status: DC | PRN

## 2017-11-09 MED ORDER — SODIUM CHLORIDE 0.9 % IV SOLN
INTRAVENOUS | Status: DC
  Administered 2017-11-09 – 2017-11-10 (×3): via INTRAVENOUS

## 2017-11-09 MED ORDER — PHENOL 1.4 % MT LIQD: 1.0000 | OROMUCOSAL | Status: DC | PRN

## 2017-11-09 MED ORDER — DEXAMETHASONE SODIUM PHOSPHATE 10 MG/ML IJ SOLN
INTRAMUSCULAR | Status: AC
  Filled 2017-11-09: qty 1

## 2017-11-09 MED ORDER — SODIUM CHLORIDE 0.9 % IR SOLN
Status: DC | PRN
  Administered 2017-11-09: 1000 mL

## 2017-11-09 MED ORDER — PROPOFOL 10 MG/ML IV BOLUS
INTRAVENOUS | Status: AC
  Filled 2017-11-09: qty 60

## 2017-11-09 MED ORDER — BUPIVACAINE IN DEXTROSE 0.75-8.25 % IT SOLN
INTRATHECAL | Status: DC | PRN
  Administered 2017-11-09: 1.5 mL via INTRATHECAL

## 2017-11-09 MED ORDER — CEFAZOLIN SODIUM-DEXTROSE 2-4 GM/100ML-% IV SOLN
2.0000 g | INTRAVENOUS | Status: AC
  Administered 2017-11-09: 2 g via INTRAVENOUS
  Filled 2017-11-09: qty 100

## 2017-11-09 MED ORDER — LIDOCAINE HCL (CARDIAC) PF 100 MG/5ML IV SOSY
PREFILLED_SYRINGE | INTRAVENOUS | Status: DC | PRN
  Administered 2017-11-09: 20 mg via INTRAVENOUS

## 2017-11-09 MED ORDER — SODIUM CHLORIDE 0.9 % IV SOLN
INTRAVENOUS | Status: DC
  Administered 2017-11-09 (×2): via INTRAVENOUS

## 2017-11-09 MED ORDER — DEXAMETHASONE SODIUM PHOSPHATE 10 MG/ML IJ SOLN
INTRAMUSCULAR | Status: DC | PRN
  Administered 2017-11-09: 10 mg via INTRAVENOUS

## 2017-11-09 MED ORDER — MORPHINE SULFATE (PF) 2 MG/ML IV SOLN: 0.5000 mg | INTRAVENOUS | Status: DC | PRN

## 2017-11-09 MED ORDER — HYDROCODONE-ACETAMINOPHEN 7.5-325 MG PO TABS
1.0000 | ORAL_TABLET | ORAL | Status: DC | PRN
  Administered 2017-11-10 (×2): 1 via ORAL
  Filled 2017-11-09 (×2): qty 1

## 2017-11-09 MED ORDER — CEFAZOLIN SODIUM-DEXTROSE 2-4 GM/100ML-% IV SOLN
2.0000 g | Freq: Four times a day (QID) | INTRAVENOUS | Status: AC
Start: 2017-11-09 — End: 2017-11-10
  Administered 2017-11-09 – 2017-11-10 (×2): 2 g via INTRAVENOUS
  Filled 2017-11-09 (×2): qty 100

## 2017-11-09 MED ORDER — POLYETHYLENE GLYCOL 3350 17 G PO PACK
17.0000 g | PACK | Freq: Two times a day (BID) | ORAL | Status: DC
  Filled 2017-11-09: qty 1

## 2017-11-09 MED ORDER — ONDANSETRON HCL 4 MG/2ML IJ SOLN: 4.0000 mg | Freq: Four times a day (QID) | INTRAMUSCULAR | Status: DC | PRN

## 2017-11-09 MED ORDER — ACETAMINOPHEN 325 MG PO TABS: 325.0000 mg | ORAL_TABLET | Freq: Four times a day (QID) | ORAL | Status: DC | PRN

## 2017-11-09 MED ORDER — POLYETHYLENE GLYCOL 3350 17 G PO PACK: 17.0000 g | PACK | Freq: Two times a day (BID) | ORAL | 0 refills | Status: DC

## 2017-11-09 SURGICAL SUPPLY — 46 items
ADH SKN CLS APL DERMABOND .7 (GAUZE/BANDAGES/DRESSINGS) ×1
BAG DECANTER FOR FLEXI CONT (MISCELLANEOUS) IMPLANT
BAG SPEC THK2 15X12 ZIP CLS (MISCELLANEOUS)
BAG ZIPLOCK 12X15 (MISCELLANEOUS) IMPLANT
BLADE SAG 18X100X1.27 (BLADE) ×2 IMPLANT
CAPT HIP TOTAL 2 ×2 IMPLANT
COVER PERINEAL POST (MISCELLANEOUS) ×2 IMPLANT
COVER SURGICAL LIGHT HANDLE (MISCELLANEOUS) ×2 IMPLANT
DERMABOND ADVANCED (GAUZE/BANDAGES/DRESSINGS) ×1
DERMABOND ADVANCED .7 DNX12 (GAUZE/BANDAGES/DRESSINGS) ×1 IMPLANT
DRAPE STERI IOBAN 125X83 (DRAPES) ×2 IMPLANT
DRAPE U-SHAPE 47X51 STRL (DRAPES) ×4 IMPLANT
DRESSING AQUACEL AG SP 3.5X10 (GAUZE/BANDAGES/DRESSINGS) ×1 IMPLANT
DRSG AQUACEL AG SP 3.5X10 (GAUZE/BANDAGES/DRESSINGS) ×2
DURAPREP 26ML APPLICATOR (WOUND CARE) ×2 IMPLANT
ELECT REM PT RETURN 15FT ADLT (MISCELLANEOUS) ×2 IMPLANT
GLOVE BIOGEL M STRL SZ7.5 (GLOVE) IMPLANT
GLOVE BIOGEL PI IND STRL 7.0 (GLOVE) ×2 IMPLANT
GLOVE BIOGEL PI IND STRL 7.5 (GLOVE) ×2 IMPLANT
GLOVE BIOGEL PI IND STRL 8 (GLOVE) ×1 IMPLANT
GLOVE BIOGEL PI IND STRL 8.5 (GLOVE) ×1 IMPLANT
GLOVE BIOGEL PI INDICATOR 7.0 (GLOVE) ×2
GLOVE BIOGEL PI INDICATOR 7.5 (GLOVE) ×2
GLOVE BIOGEL PI INDICATOR 8 (GLOVE) ×1
GLOVE BIOGEL PI INDICATOR 8.5 (GLOVE) ×1
GLOVE ECLIPSE 8.0 STRL XLNG CF (GLOVE) ×4 IMPLANT
GLOVE ORTHO TXT STRL SZ7.5 (GLOVE) ×2 IMPLANT
GLOVE SURG SS PI 7.5 STRL IVOR (GLOVE) ×2 IMPLANT
GLOVE SURG SS PI 8.0 STRL IVOR (GLOVE) ×2 IMPLANT
GOWN SPEC L4 XLG W/TWL (GOWN DISPOSABLE) ×2 IMPLANT
GOWN STRL REIN 3XL XLG LVL4 (GOWN DISPOSABLE) ×2 IMPLANT
GOWN STRL REUS W/TWL 2XL LVL3 (GOWN DISPOSABLE) ×2 IMPLANT
GOWN STRL REUS W/TWL LRG LVL3 (GOWN DISPOSABLE) ×4 IMPLANT
GOWN STRL REUS W/TWL XL LVL3 (GOWN DISPOSABLE) IMPLANT
HOLDER FOLEY CATH W/STRAP (MISCELLANEOUS) ×2 IMPLANT
PACK ANTERIOR HIP CUSTOM (KITS) ×2 IMPLANT
SUT MNCRL AB 4-0 PS2 18 (SUTURE) ×2 IMPLANT
SUT STRATAFIX 0 PDS 27 VIOLET (SUTURE) ×2
SUT VIC AB 1 CT1 36 (SUTURE) ×6 IMPLANT
SUT VIC AB 2-0 CT1 27 (SUTURE) ×4
SUT VIC AB 2-0 CT1 TAPERPNT 27 (SUTURE) ×2 IMPLANT
SUTURE STRATFX 0 PDS 27 VIOLET (SUTURE) ×1 IMPLANT
TRAY FOLEY CATH 14FRSI W/METER (CATHETERS) ×2 IMPLANT
TRAY FOLEY MTR SLVR 16FR STAT (SET/KITS/TRAYS/PACK) IMPLANT
WATER STERILE IRR 1000ML POUR (IV SOLUTION) ×2 IMPLANT
YANKAUER SUCT BULB TIP 10FT TU (MISCELLANEOUS) IMPLANT

## 2017-11-09 NOTE — Op Note (Signed)
NAME:  Jeanne Webb                ACCOUNT NO.: [id number]      MEDICAL RECORD NO.: [id number]      FACILITY:  [facility]      PHYSICIAN:  Mauri Pole  DATE OF BIRTH:  February 09, 1951     DATE OF PROCEDURE:  11/09/2017                                 OPERATIVE REPORT         PREOPERATIVE DIAGNOSIS: Left  hip osteoarthritis.      POSTOPERATIVE DIAGNOSIS:  Left hip osteoarthritis.      PROCEDURE:  Left total hip replacement through an anterior approach   utilizing DePuy THR system, component size 16mm pinnacle cup, a size 36+4 neutral   Altrex liner, a size 5 standard Tri Lock stem with a 36+8.5 delta ceramic   ball.      SURGEON:  Pietro Cassis. Alvan Dame, M.D.      ASSISTANT:  Danae Orleans, PA-C     ANESTHESIA:  Spinal.      SPECIMENS:  None.      COMPLICATIONS:  None.      BLOOD LOSS:  300 cc     DRAINS:  None.      INDICATION OF THE PROCEDURE:  Jeanne Webb is a 67 y.o. female who had   presented to office for evaluation of left hip pain.  Radiographs revealed   progressive degenerative changes with bone-on-bone   articulation to the  hip joint.  The patient had painful limited range of   motion significantly affecting their overall quality of life.  The patient was failing to    respond to conservative measures, and at this point was ready   to proceed with more definitive measures.  The patient has noted progressive   degenerative changes in her hip, progressive problems and dysfunction   with regarding the hip prior to surgery.  Consent was obtained for   benefit of pain relief.  Specific risk of infection, DVT, component   failure, dislocation, need for revision surgery, as well discussion of   the anterior versus posterior approach were reviewed.  Consent was   obtained for benefit of anterior pain relief through an anterior   approach.      PROCEDURE IN DETAIL:  The patient was brought to operative theater.   Once adequate anesthesia,  preoperative antibiotics, 2 gm of Ancef, 1 gm of Tranexamic Acid, and 10 mg of Decadron administered.   The patient was positioned supine on the OSI Hanna table.  Once adequate   padding of boney process was carried out, we had predraped out the hip, and  used fluoroscopy to confirm orientation of the pelvis and position.      The left hip was then prepped and draped from proximal iliac crest to   mid thigh with shower curtain technique.      Time-out was performed identifying the patient, planned procedure, and   extremity.     An incision was then made 2 cm distal and lateral to the   anterior superior iliac spine extending over the orientation of the   tensor fascia lata muscle and sharp dissection was carried down to the   fascia of the muscle and protractor placed in the soft tissues.      The  fascia was then incised.  The muscle belly was identified and swept   laterally and retractor placed along the superior neck.  Following   cauterization of the circumflex vessels and removing some pericapsular   fat, a second cobra retractor was placed on the inferior neck.  A third   retractor was placed on the anterior acetabulum after elevating the   anterior rectus.  A L-capsulotomy was along the line of the   superior neck to the trochanteric fossa, then extended proximally and   distally.  Tag sutures were placed and the retractors were then placed   intracapsular.  We then identified the trochanteric fossa and   orientation of my neck cut, confirmed this radiographically   and then made a neck osteotomy with the femur on traction.  The femoral   head was removed without difficulty or complication.  Traction was let   off and retractors were placed posterior and anterior around the   acetabulum.      The labrum and foveal tissue were debrided.  I began reaming with a 29mm   reamer and reamed up to 39mm reamer with good bony bed preparation and a 18mm   cup was chosen.  The final 34mm  Pinnacle cup was then impacted under fluoroscopy  to confirm the depth of penetration and orientation with respect to   abduction.  A screw was placed followed by the hole eliminator.  The final   36+4 neutral Altrex liner was impacted with good visualized rim fit.  The cup was positioned anatomically within the acetabular portion of the pelvis.      At this point, the femur was rolled at 80 degrees.  Further capsule was   released off the inferior aspect of the femoral neck.  I then   released the superior capsule proximally.  The hook was placed laterally   along the femur and elevated manually and held in position with the bed   hook.  The leg was then extended and adducted with the leg rolled to 100   degrees of external rotation.  Once the proximal femur was fully   exposed, I used a box osteotome to set orientation.  I then began   broaching with the starting chili pepper broach and passed this by hand and then broached up to 5.  With the 5 broach in place I chose a standard offset neck and did several trial reductions.  The offset was appropriate, leg lengths  appeared to be equal best matched with the +8.5 head ball confirmed radiographically.   Given these findings, I went ahead and dislocated the hip, repositioned all   retractors and positioned the right hip in the extended and abducted position.  The final 5 standard Tri Lock stem was   chosen and it was impacted down to the level of neck cut.  Based on this   and the trial reduction, a 36+8.5 delta ceramic ball was chosen and   impacted onto a clean and dry trunnion, and the hip was reduced.  The   hip had been irrigated throughout the case again at this point.  The fascia of the   tensor fascia lata muscle was then reapproximated using #1 Vicryl.  The   remaining wound was closed with 2-0 Vicryl and running 4-0 Monocryl.   The hip was cleaned, dried, and dressed sterilely using Dermabond and   Aquacel dressing.  She was then  brought   to recovery room in stable condition  tolerating the procedure well.    Danae Orleans, PA-C was present for the entirety of the case involved from   preoperative positioning, perioperative retractor management, general   facilitation of the case, as well as primary wound closure as assistant.            Pietro Cassis Alvan Dame, M.D.        11/09/2017 12:52 PM

## 2017-11-09 NOTE — Anesthesia Procedure Notes (Signed)
Spinal  Patient location during procedure: OR Staffing Anesthesiologist: Montez Hageman, MD Performed: anesthesiologist  Preanesthetic Checklist Completed: patient identified, site marked, surgical consent, pre-op evaluation, timeout performed, IV checked, risks and benefits discussed and monitors and equipment checked Spinal Block Patient position: sitting Prep: DuraPrep Patient monitoring: heart rate, continuous pulse ox and blood pressure Approach: right paramedian Location: L4-5 Injection technique: single-shot Needle Needle type: Sprotte  Needle gauge: 24 G Needle length: 9 cm Additional Notes Expiration date of kit checked and confirmed. Patient tolerated procedure well, without complications.

## 2017-11-09 NOTE — Interval H&P Note (Signed)
History and Physical Interval Note:  11/09/2017 11:27 AM  Jeanne Webb  has presented today for surgery, with the diagnosis of Left Hip Osteoarthritis  The various methods of treatment have been discussed with the patient and family. After consideration of risks, benefits and other options for treatment, the patient has consented to  Procedure(s): LEFT TOTAL HIP ARTHROPLASTY ANTERIOR APPROACH (Left) as a surgical intervention .  The patient's history has been reviewed, patient examined, no change in status, stable for surgery.  I have reviewed the patient's chart and labs.  Questions were answered to the patient's satisfaction.     Mauri Pole

## 2017-11-09 NOTE — Progress Notes (Signed)
PT Cancellation Note  Patient Details Name: Jeanne Webb MRN: [id number] DOB: February 21, 1951   Cancelled Treatment:      Pt not feelings well today, and low BP. Was told by nursing to hold seeing pt POD0 today.   Clide Dales 11/09/2017, 6:09 PM

## 2017-11-09 NOTE — Discharge Instructions (Addendum)

## 2017-11-09 NOTE — Transfer of Care (Signed)
Immediate Anesthesia Transfer of Care Note  Patient: Jeanne Webb  Procedure(s) Performed: LEFT TOTAL HIP ARTHROPLASTY ANTERIOR APPROACH (Left Hip)  Patient Location: PACU  Anesthesia Type:Spinal  Level of Consciousness: awake, alert , oriented and patient cooperative  Airway & Oxygen Therapy: Patient Spontanous Breathing and Patient connected to face mask oxygen  Post-op Assessment: Report given to RN, Post -op Vital signs reviewed and stable and Patient moving all extremities  Post vital signs: Reviewed and stable  Last Vitals:  Vitals Value Taken Time  BP 88/55 11/09/2017  2:16 PM  Temp    Pulse 84 11/09/2017  2:19 PM  Resp 24 11/09/2017  2:19 PM  SpO2 100 % 11/09/2017  2:19 PM  Vitals shown include unvalidated device data.  Last Pain:  Vitals:   11/09/17 1053  TempSrc:   PainSc: 0-No pain      Patients Stated Pain Goal: 4 (75/17/00 1749)  Complications: No apparent anesthesia complications

## 2017-11-09 NOTE — Anesthesia Postprocedure Evaluation (Signed)
Anesthesia Post Note  Patient: Jeanne Webb  Procedure(s) Performed: LEFT TOTAL HIP ARTHROPLASTY ANTERIOR APPROACH (Left Hip)     Patient location during evaluation: PACU Anesthesia Type: Spinal Level of consciousness: awake and alert Pain management: pain level controlled Vital Signs Assessment: post-procedure vital signs reviewed and stable Respiratory status: spontaneous breathing and respiratory function stable Cardiovascular status: blood pressure returned to baseline and stable Postop Assessment: no headache, no backache, spinal receding and no apparent nausea or vomiting Anesthetic complications: no    Last Vitals:  Vitals:   11/09/17 1500 11/09/17 1530  BP: (!) 102/59 113/71  Pulse: 70 68  Resp: (!) 29 15  Temp: (!) 36.3 C (!) 36.4 C  SpO2: 100% 100%    Last Pain:  Vitals:   11/09/17 1530  TempSrc:   PainSc: 0-No pain                 Montez Hageman

## 2017-11-09 NOTE — Anesthesia Preprocedure Evaluation (Signed)
Anesthesia Evaluation  Patient identified by MRN, date of birth, ID band Patient awake    Reviewed: Allergy & Precautions, NPO status , Patient's Chart, lab work & pertinent test results  Airway Mallampati: II  TM Distance: >3 FB Neck ROM: Full    Dental no notable dental hx.    Pulmonary neg pulmonary ROS, former smoker,    Pulmonary exam normal breath sounds clear to auscultation       Cardiovascular negative cardio ROS Normal cardiovascular exam Rhythm:Regular Rate:Normal     Neuro/Psych negative neurological ROS  negative psych ROS   GI/Hepatic negative GI ROS, Neg liver ROS,   Endo/Other  negative endocrine ROS  Renal/GU negative Renal ROS  negative genitourinary   Musculoskeletal negative musculoskeletal ROS (+)   Abdominal   Peds negative pediatric ROS (+)  Hematology negative hematology ROS (+)   Anesthesia Other Findings Multiple environmental sensitivites  Reproductive/Obstetrics negative OB ROS                             Anesthesia Physical Anesthesia Plan  ASA: II  Anesthesia Plan: Spinal   Post-op Pain Management:    Induction:   PONV Risk Score and Plan: 2 and Treatment may vary due to age or medical condition  Airway Management Planned: Simple Face Mask  Additional Equipment:   Intra-op Plan:   Post-operative Plan:   Informed Consent: I have reviewed the patients History and Physical, chart, labs and discussed the procedure including the risks, benefits and alternatives for the proposed anesthesia with the patient or authorized representative who has indicated his/her understanding and acceptance.   Dental advisory given  Plan Discussed with: CRNA  Anesthesia Plan Comments: (Sedation with propofol ONLY)        Anesthesia Quick Evaluation

## 2017-11-10 LAB — BASIC METABOLIC PANEL
ANION GAP: 6 (ref 5–15)
BUN: 13 mg/dL (ref 6–20)
CALCIUM: 8.1 mg/dL — AB (ref 8.9–10.3)
CO2: 27 mmol/L (ref 22–32)
Chloride: 108 mmol/L (ref 101–111)
Creatinine, Ser: 0.54 mg/dL (ref 0.44–1.00)
GFR calc Af Amer: >60 mL/min (ref >60)
GLUCOSE: 120 mg/dL — AB (ref 65–99)
Potassium: 3.7 mmol/L (ref 3.5–5.1)
Sodium: 141 mmol/L (ref 135–145)

## 2017-11-10 LAB — CBC
HCT: 32.7 % — ABNORMAL LOW (ref 36.0–46.0)
Hemoglobin: 11 g/dL — ABNORMAL LOW (ref 12.0–15.0)
MCH: 30.8 pg (ref 26.0–34.0)
MCHC: 33.6 g/dL (ref 30.0–36.0)
MCV: 91.6 fL (ref 78.0–100.0)
PLATELETS: 220 10*3/uL (ref 150–400)
RBC: 3.57 MIL/uL — ABNORMAL LOW (ref 3.87–5.11)
RDW: 12.5 % (ref 11.5–15.5)
WBC: 7.5 10*3/uL (ref 4.0–10.5)

## 2017-11-10 MED ORDER — SODIUM CHLORIDE 0.9 % IV BOLUS
500.0000 mL | Freq: Once | INTRAVENOUS | Status: AC
  Administered 2017-11-10: 500 mL via INTRAVENOUS

## 2017-11-10 MED ORDER — ASPIRIN EC 81 MG PO TBEC
81.0000 mg | DELAYED_RELEASE_TABLET | Freq: Two times a day (BID) | ORAL | Status: DC
  Administered 2017-11-10: 81 mg via ORAL
  Filled 2017-11-10: qty 1

## 2017-11-10 MED ORDER — RIVAROXABAN 10 MG PO TABS
10.0000 mg | ORAL_TABLET | Freq: Every day | ORAL | Status: DC
  Filled 2017-11-10: qty 1

## 2017-11-10 MED ORDER — RIVAROXABAN 10 MG PO TABS: 10.0000 mg | ORAL_TABLET | Freq: Every day | ORAL | 0 refills | Status: DC

## 2017-11-10 MED ORDER — METHOCARBAMOL 500 MG PO TABS: 500.0000 mg | ORAL_TABLET | Freq: Four times a day (QID) | ORAL | 0 refills | Status: DC | PRN

## 2017-11-10 NOTE — Progress Notes (Signed)
Care Plan Notes 08/12/2017 to 11/10/2017       Care Plan by Leonides Grills A at 11/05/2017  1:39 PM    Date of Service   Author Author Type Status Note Type File Time  11/05/2017  [name]  Signed Care Plan 11/05/2017             Ortho Bundle L THA scheduled on 11-09-17 DCP:  Home with spouse.  1 story home with 2 ste.   DME:  No needs.  Rx for RW given prior to surgery.  Has elevated toilets.   PT:  HEP                 Durable Medical Equipment  (From admission, onward)        Start     Ordered   11/10/17 1044  For home use only DME Walker rolling  Once    Question:  Patient needs a walker to treat with the following condition  Answer:  Osteoarthritis of both hips   11/10/17 1043    [phone]

## 2017-11-10 NOTE — Progress Notes (Signed)
     Subjective: 1 Day Post-Op Procedure(s) (LRB): LEFT TOTAL HIP ARTHROPLASTY ANTERIOR APPROACH (Left)   Seen by Dr. Alvan Dame. Patient reports pain as mild, pain controlled. No events throughout the night.  Ready to be discharged home.     Objective:   VITALS:   Vitals:   11/10/17 0212 11/10/17 0526  BP: (!) 86/54 101/60  Pulse: (!) 59 (!) 58  Resp:  16  Temp:  97.7 F (36.5 C)  SpO2:  100%    Dorsiflexion/Plantar flexion intact Incision: dressing C/D/I No cellulitis present Compartment soft  LABS Recent Labs    11/10/17 0515  HGB 11.0*  HCT 32.7*  WBC 7.5  PLT 220    Recent Labs    11/10/17 0515  NA 141  K 3.7  BUN 13  CREATININE 0.54  GLUCOSE 120*     Assessment/Plan: 1 Day Post-Op Procedure(s) (LRB): LEFT TOTAL HIP ARTHROPLASTY ANTERIOR APPROACH (Left) Foley cath d/c'ed Advance diet Up with therapy D/C IV fluids Discharge home Follow up in 2 weeks at [facility] ([place]). Follow up with OLIN,Neelam Tiggs D in 2 weeks.  Contact information:  [facility]) [address], [address] [place]. [place]   PAC  11/10/2017, 7:24 AM

## 2017-11-10 NOTE — Progress Notes (Signed)
Spoke with Laurice Record PA regarding patient's BP 81/53, given orders for 558mL bolus and recheck BP.

## 2017-11-10 NOTE — Evaluation (Signed)
Physical Therapy One Time Evaluation Patient Details Name: Jeanne Webb MRN: [id number] DOB: 09/28/1950 Today's Date: 11/10/2017   History of Present Illness  Pt is a 67 year old female s/p L direct anterior THA  Clinical Impression  Patient evaluated by Physical Therapy with no further acute PT needs identified. All education has been completed and the patient has no further questions.  Pt ambulated in hallway and practiced safe stair technique with spouse observing.  Pt also performed LE exercises in standing with UE support.  Pt feels ready for d/c home today.  Also educated pt to slow down and use assistive device upon d/c for safety and healing. See below for any follow-up Physical Therapy or equipment needs. PT is signing off. Thank you for this referral.    Follow Up Recommendations No PT follow up;Follow surgeon's recommendation for DC plan and follow-up therapies    Equipment Recommendations  Rolling walker with 5" wheels    Recommendations for Other Services       Precautions / Restrictions Precautions Precautions: None Restrictions Other Position/Activity Restrictions: WBAT      Mobility  Bed Mobility Overal bed mobility: Modified Independent                Transfers Overall transfer level: Needs assistance Equipment used: Rolling walker (2 wheeled) Transfers: Sit to/from Stand Sit to Stand: Supervision         General transfer comment: verbal cues for safe technique  Ambulation/Gait Ambulation/Gait assistance: Supervision Ambulation Distance (Feet): 350 Feet Assistive device: Rolling walker (2 wheeled) Gait Pattern/deviations: Step-through pattern     General Gait Details: verbal cues for posture and RW positioning  Stairs Stairs: Yes Stairs assistance: Min guard;Supervision Stair Management: Step to pattern;Forwards;Two rails Number of Stairs: 10 General stair comments: pt performed 10 steps total, easily negotiated with step through  pattern as well  Wheelchair Mobility    Modified Rankin (Stroke Patients Only)       Balance                                             Pertinent Vitals/Pain Pain Assessment: No/denies pain    Home Living Family/patient expects to be discharged to:: Private residence Living Arrangements: Spouse/significant other   Type of Home: House Home Access: Stairs to enter Entrance Stairs-Rails: None Technical brewer of Steps: 2 Home Layout: One level Home Equipment: Environmental consultant - 2 wheels      Prior Function Level of Independence: Independent               Hand Dominance        Extremity/Trunk Assessment        Lower Extremity Assessment Lower Extremity Assessment: LLE deficits/detail LLE Deficits / Details: grossly 2+/5 hip strength as anticipated post op, able to perform ankle pumps       Communication   Communication: No difficulties  Cognition Arousal/Alertness: Awake/alert Behavior During Therapy: WFL for tasks assessed/performed Overall Cognitive Status: Within Functional Limits for tasks assessed                                        General Comments      Exercises Total Joint Exercises Ankle Circles/Pumps: AROM;Standing;10 reps(heel raises) Hip ABduction/ADduction: AROM;10 reps;Left;Standing Knee Flexion: AROM;10 reps;Standing;Left Marching in Standing:  AROM;10 reps;Left;Standing(all standing exercises performed with UE support) Standing Hip Extension: AROM;10 reps;Left;Standing   Assessment/Plan    PT Assessment Patent does not need any further PT services  PT Problem List         PT Treatment Interventions      PT Goals (Current goals can be found in the Care Plan section)  Acute Rehab PT Goals PT Goal Formulation: All assessment and education complete, DC therapy    Frequency     Barriers to discharge        Co-evaluation               AM-PAC PT "6 Clicks" Daily Activity  Outcome  Measure Difficulty turning over in bed (including adjusting bedclothes, sheets and blankets)?: None Difficulty moving from lying on back to sitting on the side of the bed? : None Difficulty sitting down on and standing up from a chair with arms (e.g., wheelchair, bedside commode, etc,.)?: None Help needed moving to and from a bed to chair (including a wheelchair)?: A Little Help needed walking in hospital room?: A Little Help needed climbing 3-5 steps with a railing? : A Little 6 Click Score: 21    End of Session Equipment Utilized During Treatment: Gait belt Activity Tolerance: Patient tolerated treatment well Patient left: in bed;with call bell/phone within reach;with family/visitor present Nurse Communication: Mobility status PT Visit Diagnosis: Difficulty in walking, not elsewhere classified (R26.2)    Time: 6553-7482 PT Time Calculation (min) (ACUTE ONLY): 35 min   Charges:   PT Evaluation $PT Eval Low Complexity: 1 Low PT Treatments $Gait Training: 8-22 mins   PT G Codes:      Carmelia Bake, PT, DPT 11/10/2017 Pager: [phone]   [name] 11/10/2017, 12:30 PM

## 2017-11-11 NOTE — Discharge Summary (Signed)
Physician Discharge Summary  Patient ID: Jeanne Webb MRN: [id number] DOB/AGE: Feb 02, 1951 67 y.o.  Admit date: 11/09/2017 Discharge date: 11/10/2017   Procedures:  Procedure(s) (LRB): LEFT TOTAL HIP ARTHROPLASTY ANTERIOR APPROACH (Left)  Attending Physician:  Dr. Paralee Webb   Admission Diagnoses:    Left hip primary OA / pain  Discharge Diagnoses:  Principal Problem:   S/P left THA, AA Active Problems:   S/P hip replacement  Past Medical History:  Diagnosis Date  . Allergy   . Arthritis   . Cramp of limb   . Plantar fascial fibromatosis     HPI:    Jeanne Webb, 67 y.o. female, has a history of pain and functional disability in the left hip(s) due to arthritis and patient has failed non-surgical conservative treatments for greater than 12 weeks to include NSAID's and/or analgesics and activity modification.  Onset of symptoms was gradual starting 11+ years ago with gradually worsening course since that time.The patient noted no past surgery on the left hip(s).  Patient currently rates pain in the left hip at 10 out of 10 with activity. Patient has worsening of pain with activity and weight bearing, trendelenberg gait, pain that interfers with activities of daily living and pain with passive range of motion. Patient has evidence of periarticular osteophytes and joint space narrowing by imaging studies. This condition presents safety issues increasing the risk of falls. There is no current active infection.  Risks, benefits and expectations were discussed with the patient.  Risks including but not limited to the risk of anesthesia, blood clots, nerve damage, blood vessel damage, failure of the prosthesis, infection and up to and including death.  Patient understand the risks, benefits and expectations and wishes to proceed with surgery.   PCP: Jeanne Curry, DO   Discharged Condition: good  Hospital Course:  Patient underwent the above stated procedure on 11/09/2017. Patient  tolerated the procedure well and brought to the recovery room in good condition and subsequently to the floor.  POD #1 BP: 101/60 ; Pulse: 58 ; Temp: 97.7 F (36.5 C) ; Resp: 16 Patient reports pain as mild, pain controlled. No events throughout the night.  Ready to be discharged home.  Dorsiflexion/plantar flexion intact, incision: dressing C/D/I, no cellulitis present and compartment soft.   LABS  Basename    HGB     11.0  HCT     32.7    Discharge Exam: General appearance: alert, cooperative and no distress Extremities: Homans sign is negative, no sign of DVT, no edema, redness or tenderness in the calves or thighs and no ulcers, gangrene or trophic changes  Disposition:  Home with follow up in 2 weeks   Follow-up Information    Jeanne Cancel, MD. Schedule an appointment as soon as possible for a visit in 2 weeks.   Specialty:  Orthopedic Surgery Contact information: [address] [place] [address] [place] [address] [phone]        [facility] - Dme Follow up.   Why:  walker Contact information: [address] [place] [address] [phone]           Discharge Instructions    Call MD / Call 911   Complete by:  As directed    If you experience chest pain or shortness of breath, CALL 911 and be transported to the hospital emergency room.  If you develope a fever above 101 F, pus (white drainage) or increased drainage or redness at the  wound, or calf pain, call your surgeon's office.   Change dressing   Complete by:  As directed    Maintain surgical dressing until follow up in the clinic. If the edges start to pull up, may reinforce with tape. If the dressing is no longer working, may remove and cover with gauze and tape, but must keep the area dry and clean.  Call with any questions or concerns.   Constipation Prevention   Complete by:  As directed    Drink plenty of fluids.  Prune juice may be helpful.  You may use a stool softener, such  as Colace (over the counter) 100 mg twice a day.  Use MiraLax (over the counter) for constipation as needed.   Diet - low sodium heart healthy   Complete by:  As directed    Discharge instructions   Complete by:  As directed    Maintain surgical dressing until follow up in the clinic. If the edges start to pull up, may reinforce with tape. If the dressing is no longer working, may remove and cover with gauze and tape, but must keep the area dry and clean.  Follow up in 2 weeks at [facility]. Call with any questions or concerns.   Increase activity slowly as tolerated   Complete by:  As directed    Weight bearing as tolerated with assist device (walker, cane, etc) as directed, use it as long as suggested by your surgeon or therapist, typically at least 4-6 weeks.   TED hose   Complete by:  As directed    Use stockings (TED hose) for 2 weeks on both leg(s).  You may remove them at night for sleeping.      Allergies as of 11/10/2017      Reactions   Water Oral [alimentum] Anaphylaxis   Lips go numb, throat swelling... (able to  tolerate [place] water and [place])   Lactose Intolerance (gi)    Unknown reaction    Nickel    Pain    Other    Polyester- skin burns  Scented laundry detergent - eyes burn  Pepper spice- eyes burn Processed food     Seldane [terfenadine]    "knocked me out"   Penicillins Rash   Has patient had a PCN reaction causing immediate rash, facial/tongue/throat swelling, SOB or lightheadedness with hypotension: Yes Has patient had a PCN reaction causing severe rash involving mucus membranes or skin necrosis: No Has patient had a PCN reaction that required hospitalization: No Has patient had a PCN reaction occurring within the last 10 years: No If all of the above answers are "NO", then may proceed with Cephalosporin use.      Medication List    TAKE these medications   aspirin 81 MG chewable tablet Commonly known as:  ASPIRIN CHILDRENS Chew 1 tablet  (81 mg total) by mouth 2 (two) times daily. Take for 4 weeks, then resume regular dose.   B COMPLEX-FOLIC ACID PO Take 1 tablet by mouth daily.   CAL-MAG-ZINC PO Take 2 tablets by mouth 2 (two) times daily.   docusate sodium 100 MG capsule Commonly known as:  COLACE Take 1 capsule (100 mg total) by mouth 2 (two) times daily.   HYDROcodone-acetaminophen 7.5-325 MG tablet Commonly known as:  NORCO Take 1-2 tablets by mouth every 4 (four) hours as needed for moderate pain.   JUICE PLUS FIBRE PO Take 2 tablets by mouth 2 (two) times daily.   Magnesium 500 MG Tabs Take  500 mg by mouth daily.   methocarbamol 500 MG tablet Commonly known as:  ROBAXIN Take 1 tablet (500 mg total) by mouth every 6 (six) hours as needed for muscle spasms.   polyethylene glycol packet Commonly known as:  MIRALAX / GLYCOLAX Take 17 g by mouth 2 (two) times daily.   vitamin C 1000 MG tablet Take 2,000 mg by mouth daily.   Vitamin D3 5000 units Caps Take 5,000 Units by mouth 2 (two) times daily.   vitamin E 400 UNIT capsule Take 400 Units by mouth daily.            Discharge Care Instructions  (From admission, onward)        Start     Ordered   11/10/17 0000  Change dressing    Comments:  Maintain surgical dressing until follow up in the clinic. If the edges start to pull up, may reinforce with tape. If the dressing is no longer working, may remove and cover with gauze and tape, but must keep the area dry and clean.  Call with any questions or concerns.   11/10/17 0730       Signed: [place]. Paulena Servais   PA-C  11/11/2017, 11:09 AM

## 2017-12-22 DIAGNOSIS — Z471 Aftercare following joint replacement surgery: Secondary | ICD-10-CM | POA: Diagnosis not present

## 2017-12-22 DIAGNOSIS — Z96642 Presence of left artificial hip joint: Secondary | ICD-10-CM | POA: Diagnosis not present

## 2018-02-02 DIAGNOSIS — M1612 Unilateral primary osteoarthritis, left hip: Secondary | ICD-10-CM | POA: Diagnosis not present

## 2018-02-24 ENCOUNTER — Ambulatory Visit: Payer: Medicare Other

## 2018-05-20 DIAGNOSIS — Z029 Encounter for administrative examinations, unspecified: Secondary | ICD-10-CM

## 2018-07-19 DIAGNOSIS — H2513 Age-related nuclear cataract, bilateral: Secondary | ICD-10-CM | POA: Diagnosis not present

## 2018-07-19 DIAGNOSIS — H43813 Vitreous degeneration, bilateral: Secondary | ICD-10-CM | POA: Diagnosis not present

## 2018-07-19 DIAGNOSIS — H25013 Cortical age-related cataract, bilateral: Secondary | ICD-10-CM | POA: Diagnosis not present

## 2018-07-19 DIAGNOSIS — H5053 Vertical heterophoria: Secondary | ICD-10-CM | POA: Diagnosis not present

## 2018-07-19 DIAGNOSIS — D3131 Benign neoplasm of right choroid: Secondary | ICD-10-CM | POA: Diagnosis not present

## 2018-07-19 DIAGNOSIS — H40003 Preglaucoma, unspecified, bilateral: Secondary | ICD-10-CM | POA: Diagnosis not present

## 2018-10-02 ENCOUNTER — Encounter: Payer: Self-pay | Admitting: Internal Medicine

## 2018-11-30 DIAGNOSIS — Z471 Aftercare following joint replacement surgery: Secondary | ICD-10-CM | POA: Diagnosis not present

## 2018-11-30 DIAGNOSIS — Z96642 Presence of left artificial hip joint: Secondary | ICD-10-CM | POA: Diagnosis not present

## 2018-11-30 DIAGNOSIS — M1611 Unilateral primary osteoarthritis, right hip: Secondary | ICD-10-CM | POA: Diagnosis not present

## 2019-01-06 ENCOUNTER — Other Ambulatory Visit: Payer: Self-pay

## 2019-02-28 ENCOUNTER — Telehealth: Payer: Self-pay | Admitting: *Deleted

## 2019-02-28 NOTE — Telephone Encounter (Signed)
Patient called and stated that she needs a letter to give to [employer] ([employer]) stating for her Physical and Mental Wellbeing she needs activity of having a gym available.   Please Advise.

## 2019-02-28 NOTE — Telephone Encounter (Signed)
Letter printed at [place] desk to be signed on Thursday.

## 2019-03-02 NOTE — Telephone Encounter (Signed)
Rhianna Notified and will pick up from office.

## 2019-03-31 DIAGNOSIS — M542 Cervicalgia: Secondary | ICD-10-CM | POA: Diagnosis not present

## 2019-03-31 DIAGNOSIS — M546 Pain in thoracic spine: Secondary | ICD-10-CM | POA: Diagnosis not present

## 2019-03-31 DIAGNOSIS — M9901 Segmental and somatic dysfunction of cervical region: Secondary | ICD-10-CM | POA: Diagnosis not present

## 2019-03-31 DIAGNOSIS — M9902 Segmental and somatic dysfunction of thoracic region: Secondary | ICD-10-CM | POA: Diagnosis not present

## 2019-04-03 DIAGNOSIS — M546 Pain in thoracic spine: Secondary | ICD-10-CM | POA: Diagnosis not present

## 2019-04-03 DIAGNOSIS — M542 Cervicalgia: Secondary | ICD-10-CM | POA: Diagnosis not present

## 2019-04-03 DIAGNOSIS — M9902 Segmental and somatic dysfunction of thoracic region: Secondary | ICD-10-CM | POA: Diagnosis not present

## 2019-04-03 DIAGNOSIS — M9901 Segmental and somatic dysfunction of cervical region: Secondary | ICD-10-CM | POA: Diagnosis not present

## 2019-04-05 DIAGNOSIS — M9901 Segmental and somatic dysfunction of cervical region: Secondary | ICD-10-CM | POA: Diagnosis not present

## 2019-04-05 DIAGNOSIS — M542 Cervicalgia: Secondary | ICD-10-CM | POA: Diagnosis not present

## 2019-04-05 DIAGNOSIS — M9902 Segmental and somatic dysfunction of thoracic region: Secondary | ICD-10-CM | POA: Diagnosis not present

## 2019-04-05 DIAGNOSIS — M546 Pain in thoracic spine: Secondary | ICD-10-CM | POA: Diagnosis not present

## 2019-04-07 DIAGNOSIS — M9902 Segmental and somatic dysfunction of thoracic region: Secondary | ICD-10-CM | POA: Diagnosis not present

## 2019-04-07 DIAGNOSIS — M542 Cervicalgia: Secondary | ICD-10-CM | POA: Diagnosis not present

## 2019-04-07 DIAGNOSIS — M25551 Pain in right hip: Secondary | ICD-10-CM | POA: Diagnosis not present

## 2019-04-07 DIAGNOSIS — Z96642 Presence of left artificial hip joint: Secondary | ICD-10-CM | POA: Diagnosis not present

## 2019-04-07 DIAGNOSIS — Z471 Aftercare following joint replacement surgery: Secondary | ICD-10-CM | POA: Diagnosis not present

## 2019-04-07 DIAGNOSIS — M546 Pain in thoracic spine: Secondary | ICD-10-CM | POA: Diagnosis not present

## 2019-04-07 DIAGNOSIS — M9901 Segmental and somatic dysfunction of cervical region: Secondary | ICD-10-CM | POA: Diagnosis not present

## 2019-04-10 DIAGNOSIS — M546 Pain in thoracic spine: Secondary | ICD-10-CM | POA: Diagnosis not present

## 2019-04-10 DIAGNOSIS — M9902 Segmental and somatic dysfunction of thoracic region: Secondary | ICD-10-CM | POA: Diagnosis not present

## 2019-04-10 DIAGNOSIS — M542 Cervicalgia: Secondary | ICD-10-CM | POA: Diagnosis not present

## 2019-04-10 DIAGNOSIS — M9901 Segmental and somatic dysfunction of cervical region: Secondary | ICD-10-CM | POA: Diagnosis not present

## 2019-04-12 DIAGNOSIS — M9902 Segmental and somatic dysfunction of thoracic region: Secondary | ICD-10-CM | POA: Diagnosis not present

## 2019-04-12 DIAGNOSIS — M546 Pain in thoracic spine: Secondary | ICD-10-CM | POA: Diagnosis not present

## 2019-04-12 DIAGNOSIS — M9901 Segmental and somatic dysfunction of cervical region: Secondary | ICD-10-CM | POA: Diagnosis not present

## 2019-04-12 DIAGNOSIS — M542 Cervicalgia: Secondary | ICD-10-CM | POA: Diagnosis not present

## 2019-04-14 DIAGNOSIS — M9901 Segmental and somatic dysfunction of cervical region: Secondary | ICD-10-CM | POA: Diagnosis not present

## 2019-04-14 DIAGNOSIS — M9902 Segmental and somatic dysfunction of thoracic region: Secondary | ICD-10-CM | POA: Diagnosis not present

## 2019-04-14 DIAGNOSIS — M546 Pain in thoracic spine: Secondary | ICD-10-CM | POA: Diagnosis not present

## 2019-04-14 DIAGNOSIS — M542 Cervicalgia: Secondary | ICD-10-CM | POA: Diagnosis not present

## 2019-04-17 DIAGNOSIS — M542 Cervicalgia: Secondary | ICD-10-CM | POA: Diagnosis not present

## 2019-04-17 DIAGNOSIS — M9902 Segmental and somatic dysfunction of thoracic region: Secondary | ICD-10-CM | POA: Diagnosis not present

## 2019-04-17 DIAGNOSIS — M546 Pain in thoracic spine: Secondary | ICD-10-CM | POA: Diagnosis not present

## 2019-04-17 DIAGNOSIS — M9901 Segmental and somatic dysfunction of cervical region: Secondary | ICD-10-CM | POA: Diagnosis not present

## 2019-04-19 DIAGNOSIS — M542 Cervicalgia: Secondary | ICD-10-CM | POA: Diagnosis not present

## 2019-04-19 DIAGNOSIS — M9902 Segmental and somatic dysfunction of thoracic region: Secondary | ICD-10-CM | POA: Diagnosis not present

## 2019-04-19 DIAGNOSIS — M546 Pain in thoracic spine: Secondary | ICD-10-CM | POA: Diagnosis not present

## 2019-04-19 DIAGNOSIS — M9901 Segmental and somatic dysfunction of cervical region: Secondary | ICD-10-CM | POA: Diagnosis not present

## 2019-04-20 DIAGNOSIS — M25551 Pain in right hip: Secondary | ICD-10-CM | POA: Diagnosis not present

## 2019-04-20 DIAGNOSIS — M25552 Pain in left hip: Secondary | ICD-10-CM | POA: Diagnosis not present

## 2019-04-24 DIAGNOSIS — M9901 Segmental and somatic dysfunction of cervical region: Secondary | ICD-10-CM | POA: Diagnosis not present

## 2019-04-24 DIAGNOSIS — M546 Pain in thoracic spine: Secondary | ICD-10-CM | POA: Diagnosis not present

## 2019-04-24 DIAGNOSIS — M542 Cervicalgia: Secondary | ICD-10-CM | POA: Diagnosis not present

## 2019-04-24 DIAGNOSIS — M9902 Segmental and somatic dysfunction of thoracic region: Secondary | ICD-10-CM | POA: Diagnosis not present

## 2019-04-26 DIAGNOSIS — M25551 Pain in right hip: Secondary | ICD-10-CM | POA: Diagnosis not present

## 2019-04-26 DIAGNOSIS — M25552 Pain in left hip: Secondary | ICD-10-CM | POA: Diagnosis not present

## 2019-04-26 DIAGNOSIS — M9902 Segmental and somatic dysfunction of thoracic region: Secondary | ICD-10-CM | POA: Diagnosis not present

## 2019-04-26 DIAGNOSIS — M546 Pain in thoracic spine: Secondary | ICD-10-CM | POA: Diagnosis not present

## 2019-04-26 DIAGNOSIS — M542 Cervicalgia: Secondary | ICD-10-CM | POA: Diagnosis not present

## 2019-04-26 DIAGNOSIS — M9901 Segmental and somatic dysfunction of cervical region: Secondary | ICD-10-CM | POA: Diagnosis not present

## 2019-04-28 DIAGNOSIS — M25552 Pain in left hip: Secondary | ICD-10-CM | POA: Diagnosis not present

## 2019-04-28 DIAGNOSIS — M25551 Pain in right hip: Secondary | ICD-10-CM | POA: Diagnosis not present

## 2019-05-01 DIAGNOSIS — M25552 Pain in left hip: Secondary | ICD-10-CM | POA: Diagnosis not present

## 2019-05-01 DIAGNOSIS — M25551 Pain in right hip: Secondary | ICD-10-CM | POA: Diagnosis not present

## 2019-05-01 DIAGNOSIS — M9902 Segmental and somatic dysfunction of thoracic region: Secondary | ICD-10-CM | POA: Diagnosis not present

## 2019-05-01 DIAGNOSIS — M542 Cervicalgia: Secondary | ICD-10-CM | POA: Diagnosis not present

## 2019-05-01 DIAGNOSIS — M546 Pain in thoracic spine: Secondary | ICD-10-CM | POA: Diagnosis not present

## 2019-05-01 DIAGNOSIS — M9901 Segmental and somatic dysfunction of cervical region: Secondary | ICD-10-CM | POA: Diagnosis not present

## 2019-05-03 LAB — HM MAMMOGRAPHY

## 2019-05-08 DIAGNOSIS — M25551 Pain in right hip: Secondary | ICD-10-CM | POA: Diagnosis not present

## 2019-05-08 DIAGNOSIS — M25552 Pain in left hip: Secondary | ICD-10-CM | POA: Diagnosis not present

## 2019-05-09 ENCOUNTER — Encounter: Payer: Self-pay | Admitting: Internal Medicine

## 2019-05-10 DIAGNOSIS — M9901 Segmental and somatic dysfunction of cervical region: Secondary | ICD-10-CM | POA: Diagnosis not present

## 2019-05-10 DIAGNOSIS — M542 Cervicalgia: Secondary | ICD-10-CM | POA: Diagnosis not present

## 2019-05-10 DIAGNOSIS — M9902 Segmental and somatic dysfunction of thoracic region: Secondary | ICD-10-CM | POA: Diagnosis not present

## 2019-05-10 DIAGNOSIS — M546 Pain in thoracic spine: Secondary | ICD-10-CM | POA: Diagnosis not present

## 2019-05-11 DIAGNOSIS — M25551 Pain in right hip: Secondary | ICD-10-CM | POA: Diagnosis not present

## 2019-05-11 DIAGNOSIS — M25552 Pain in left hip: Secondary | ICD-10-CM | POA: Diagnosis not present

## 2019-05-18 DIAGNOSIS — M25551 Pain in right hip: Secondary | ICD-10-CM | POA: Diagnosis not present

## 2019-05-18 DIAGNOSIS — M25552 Pain in left hip: Secondary | ICD-10-CM | POA: Diagnosis not present

## 2019-05-22 ENCOUNTER — Encounter: Payer: Self-pay | Admitting: Internal Medicine

## 2019-05-22 NOTE — Progress Notes (Signed)
Pt has always declined vaccinations including influenza, pneumovax, prevnar-13, tdap and shingrix.  She also repeatedly declines colon cancer screening of any type.  She has not been in to be seen for an annual check-up or any visit since 10/15/17.  She remains functionally independent in all adls.  I would like for her to come for a regular checkup so depression screen, memory screen, physical exam, functional assessment and pain assessment can be updated.  If she does not want to come in due to covid, a phone visit could be done to update all but the physical exam portion.

## 2019-05-24 ENCOUNTER — Encounter: Payer: Self-pay | Admitting: Family

## 2019-05-24 ENCOUNTER — Other Ambulatory Visit: Payer: Self-pay

## 2019-05-24 ENCOUNTER — Ambulatory Visit (INDEPENDENT_AMBULATORY_CARE_PROVIDER_SITE_OTHER): Payer: Medicare Other | Admitting: Family

## 2019-05-24 DIAGNOSIS — Z0000 Encounter for general adult medical examination without abnormal findings: Secondary | ICD-10-CM

## 2019-05-24 NOTE — Progress Notes (Signed)
This service is provided via telemedicine  No vital signs collected/recorded due to the encounter was a telemedicine visit.   Location of patient (ex: home, work):  Home   Patient consents to a telephone visit:  yes  Location of the provider (ex: office, home):  Office   Name of any referring provider:  Hollace Kinnier, [place] of all persons participating in the telemedicine service and their role in the encounter: Marlowe Sax, NP, Ruthell Rummage CMA and patient   Time spent on call: Ruthell Rummage CMA, spent  18 minutes on phone with patient

## 2019-05-24 NOTE — Patient Instructions (Signed)
Jeanne Webb , Thank you for taking time to come for your Medicare Wellness Visit. I appreciate your ongoing commitment to your health goals. Please review the following plan we discussed and let me know if I can assist you in the future.   Screening recommendations/referrals: Colonoscopy: declined  Mammogram Up to date  Bone Density: Up to date  Recommended yearly ophthalmology/optometry visit for glaucoma screening and checkup Recommended yearly dental visit for hygiene and checkup  Vaccinations: Influenza vaccine: Declined  Pneumococcal vaccine  Declined  Tdap vaccine  Declined  Shingles vaccine Declined    Advanced directives: Yes   Conditions/risks identified: Advance age female > 70 yrs,Hx of smoking   Next appointment: 1 year    Preventive Care 70 Years and Older, Female Preventive care refers to lifestyle choices and visits with your health care provider that can promote health and wellness. What does preventive care include?  A yearly physical exam. This is also called an annual well check.  Dental exams once or twice a year.  Routine eye exams. Ask your health care provider how often you should have your eyes checked.  Personal lifestyle choices, including:  Daily care of your teeth and gums.  Regular physical activity.  Eating a healthy diet.  Avoiding tobacco and drug use.  Limiting alcohol use.  Practicing safe sex.  Taking low-dose aspirin every day.  Taking vitamin and mineral supplements as recommended by your health care provider. What happens during an annual well check? The services and screenings done by your health care provider during your annual well check will depend on your age, overall health, lifestyle risk factors, and family history of disease. Counseling  Your health care provider may ask you questions about your:  Alcohol use.  Tobacco use.  Drug use.  Emotional well-being.  Home and relationship well-being.  Sexual  activity.  Eating habits.  History of falls.  Memory and ability to understand (cognition).  Work and work Statistician.  Reproductive health. Screening  You may have the following tests or measurements:  Height, weight, and BMI.  Blood pressure.  Lipid and cholesterol levels. These may be checked every 5 years, or more frequently if you are over 12 years old.  Skin check.  Lung cancer screening. You may have this screening every year starting at age 70 if you have a 30-pack-year history of smoking and currently smoke or have quit within the past 15 years.  Fecal occult blood test (FOBT) of the stool. You may have this test every year starting at age 70.  Flexible sigmoidoscopy or colonoscopy. You may have a sigmoidoscopy every 5 years or a colonoscopy every 10 years starting at age 70.  Hepatitis C blood test.  Hepatitis B blood test.  Sexually transmitted disease (STD) testing.  Diabetes screening. This is done by checking your blood sugar (glucose) after you have not eaten for a while (fasting). You may have this done every 1-3 years.  Bone density scan. This is done to screen for osteoporosis. You may have this done starting at age 70.  Mammogram. This may be done every 1-2 years. Talk to your health care provider about how often you should have regular mammograms. Talk with your health care provider about your test results, treatment options, and if necessary, the need for more tests. Vaccines  Your health care provider may recommend certain vaccines, such as:  Influenza vaccine. This is recommended every year.  Tetanus, diphtheria, and acellular pertussis (Tdap, Td) vaccine. You may need  a Td booster every 10 years.  Zoster vaccine. You may need this after age 70.  Pneumococcal 13-valent conjugate (PCV13) vaccine. One dose is recommended after age 70.  Pneumococcal polysaccharide (PPSV23) vaccine. One dose is recommended after age 70. Talk to your health care  provider about which screenings and vaccines you need and how often you need them. This information is not intended to replace advice given to you by your health care provider. Make sure you discuss any questions you have with your health care provider. Document Released: 06/21/2015 Document Revised: 02/12/2016 Document Reviewed: 03/26/2015 Elsevier Interactive Patient Education  2017 [place] Prevention in the Home Falls can cause injuries. They can happen to people of all ages. There are many things you can do to make your home safe and to help prevent falls. What can I do on the outside of my home?  Regularly fix the edges of walkways and driveways and fix any cracks.  Remove anything that might make you trip as you walk through a door, such as a raised step or threshold.  Trim any bushes or trees on the path to your home.  Use bright outdoor lighting.  Clear any walking paths of anything that might make someone trip, such as rocks or tools.  Regularly check to see if handrails are loose or broken. Make sure that both sides of any steps have handrails.  Any raised decks and porches should have guardrails on the edges.  Have any leaves, snow, or ice cleared regularly.  Use sand or salt on walking paths during winter.  Clean up any spills in your garage right away. This includes oil or grease spills. What can I do in the bathroom?  Use night lights.  Install grab bars by the toilet and in the tub and shower. Do not use towel bars as grab bars.  Use non-skid mats or decals in the tub or shower.  If you need to sit down in the shower, use a plastic, non-slip stool.  Keep the floor dry. Clean up any water that spills on the floor as soon as it happens.  Remove soap buildup in the tub or shower regularly.  Attach bath mats securely with double-sided non-slip rug tape.  Do not have throw rugs and other things on the floor that can make you trip. What can I do in  the bedroom?  Use night lights.  Make sure that you have a light by your bed that is easy to reach.  Do not use any sheets or blankets that are too big for your bed. They should not hang down onto the floor.  Have a firm chair that has side arms. You can use this for support while you get dressed.  Do not have throw rugs and other things on the floor that can make you trip. What can I do in the kitchen?  Clean up any spills right away.  Avoid walking on wet floors.  Keep items that you use a lot in easy-to-reach places.  If you need to reach something above you, use a strong step stool that has a grab bar.  Keep electrical cords out of the way.  Do not use floor polish or wax that makes floors slippery. If you must use wax, use non-skid floor wax.  Do not have throw rugs and other things on the floor that can make you trip. What can I do with my stairs?  Do not leave any items on the  stairs.  Make sure that there are handrails on both sides of the stairs and use them. Fix handrails that are broken or loose. Make sure that handrails are as long as the stairways.  Check any carpeting to make sure that it is firmly attached to the stairs. Fix any carpet that is loose or worn.  Avoid having throw rugs at the top or bottom of the stairs. If you do have throw rugs, attach them to the floor with carpet tape.  Make sure that you have a light switch at the top of the stairs and the bottom of the stairs. If you do not have them, ask someone to add them for you. What else can I do to help prevent falls?  Wear shoes that:  Do not have high heels.  Have rubber bottoms.  Are comfortable and fit you well.  Are closed at the toe. Do not wear sandals.  If you use a stepladder:  Make sure that it is fully opened. Do not climb a closed stepladder.  Make sure that both sides of the stepladder are locked into place.  Ask someone to hold it for you, if possible.  Clearly mark and  make sure that you can see:  Any grab bars or handrails.  First and last steps.  Where the edge of each step is.  Use tools that help you move around (mobility aids) if they are needed. These include:  Canes.  Walkers.  Scooters.  Crutches.  Turn on the lights when you go into a dark area. Replace any light bulbs as soon as they burn out.  Set up your furniture so you have a clear path. Avoid moving your furniture around.  If any of your floors are uneven, fix them.  If there are any pets around you, be aware of where they are.  Review your medicines with your doctor. Some medicines can make you feel dizzy. This can increase your chance of falling. Ask your doctor what other things that you can do to help prevent falls. This information is not intended to replace advice given to you by your health care provider. Make sure you discuss any questions you have with your health care provider. Document Released: 03/21/2009 Document Revised: 10/31/2015 Document Reviewed: 06/29/2014 Elsevier Interactive Patient Education  2017 [employer].

## 2019-05-24 NOTE — Progress Notes (Signed)
Subjective:   Jeanne Webb is a 68 y.o. female who presents for Medicare Annual (Subsequent) preventive examination.she denies any acute issues.  Review of Systems:  Cardiac Risk Factors include: advanced age (>89men, >80 women);smoking/ tobacco exposure     Objective:     Vitals: There were no vitals taken for this visit.  There is no height or weight on file to calculate BMI.  Advanced Directives 11/09/2017 11/02/2017 02/15/2017 02/10/2017 01/24/2016 01/19/2015  Does Patient Have a Medical Advance Directive? Yes Yes Yes Yes No Yes  Type of Advance Directive Living will Living will [place];Living will [place];Living will - [place];Living will  Does patient want to make changes to medical advance directive? No - Patient declined No - Patient declined - No - Patient declined - No - Patient declined  Copy of [place] in Chart? - - No - copy requested Yes - No - copy requested  Would patient like information on creating a medical advance directive? - - - - Yes - Educational materials given -    Tobacco Social History   Tobacco Use  Smoking Status Former Smoker  . Years: 8.00  . Types: Cigarettes  Smokeless Tobacco Never Used     Counseling given: Not Answered   Clinical Intake:  Pre-visit preparation completed: No  Pain : No/denies pain     BMI - recorded: 18 Nutritional Status: BMI of 19-24  Normal Nutritional Risks: None Diabetes: No  How often do you need to have someone help you when you read instructions, pamphlets, or other written materials from your doctor or pharmacy?: 1 - Never What is the last grade level you completed in school?: College  Interpreter Needed?: No  Information entered by :: Shalina Norfolk FNP-C  Past Medical History:  Diagnosis Date  . Allergy   . Arthritis   . Cramp of limb   . Plantar fascial fibromatosis    Past Surgical History:  Procedure Laterality  Date  . DENTAL SURGERY    . TOTAL HIP ARTHROPLASTY Left 11/09/2017   Procedure: LEFT TOTAL HIP ARTHROPLASTY ANTERIOR APPROACH;  Surgeon: Paralee Cancel, MD;  Location: [facility];  Service: Orthopedics;  Laterality: Left;   Family History  Problem Relation Age of Onset  . Cancer Mother        leukemia, ovarian, breast  . Stroke Father   . Heart disease Brother    Social History   Socioeconomic History  . Marital status: Married    Spouse name: Not on file  . Number of children: Not on file  . Years of education: Not on file  . Highest education level: Not on file  Occupational History  . Not on file  Tobacco Use  . Smoking status: Former Smoker    Years: 8.00    Types: Cigarettes  . Smokeless tobacco: Never Used  Substance and Sexual Activity  . Alcohol use: No    Alcohol/week: 0.0 standard drinks  . Drug use: No  . Sexual activity: Not Currently  Other Topics Concern  . Not on file  Social History Narrative  . Not on file   Social Determinants of Health   Financial Resource Strain:   . Difficulty of Paying Living Expenses: Not on file  Food Insecurity:   . Worried About Charity fundraiser in the Last Year: Not on file  . Ran Out of Food in the Last Year: Not on file  Transportation Needs:   .  Lack of Transportation (Medical): Not on file  . Lack of Transportation (Non-Medical): Not on file  Physical Activity:   . Days of Exercise per Week: Not on file  . Minutes of Exercise per Session: Not on file  Stress:   . Feeling of Stress : Not on file  Social Connections:   . Frequency of Communication with Friends and Family: Not on file  . Frequency of Social Gatherings with Friends and Family: Not on file  . Attends Religious Services: Not on file  . Active Member of Clubs or Organizations: Not on file  . Attends Archivist Meetings: Not on file  . Marital Status: Not on file    Outpatient Encounter Medications as of 05/24/2019  Medication Sig  . Ascorbic  Acid (VITAMIN C) 1000 MG tablet Take 2,000 mg by mouth daily.  . Calcium-Magnesium-Zinc (CAL-MAG-ZINC PO) Take 2 tablets by mouth 2 (two) times daily.  . Cholecalciferol (VITAMIN D3) 5000 units CAPS Take 5,000 Units by mouth 2 (two) times daily.  . Folic Acid-Vit Q000111Q 123456 (B COMPLEX-FOLIC ACID PO) Take 1 tablet by mouth daily.  . Nutritional Supplements (JUICE PLUS FIBRE PO) Take 2 tablets by mouth 2 (two) times daily.   . vitamin E 400 UNIT capsule Take 400 Units by mouth daily.   . [DISCONTINUED] docusate sodium (COLACE) 100 MG capsule Take 1 capsule (100 mg total) by mouth 2 (two) times daily.  . [DISCONTINUED] HYDROcodone-acetaminophen (NORCO) 7.5-325 MG tablet Take 1-2 tablets by mouth every 4 (four) hours as needed for moderate pain.  . [DISCONTINUED] Magnesium 500 MG TABS Take 500 mg by mouth daily.  . [DISCONTINUED] methocarbamol (ROBAXIN) 500 MG tablet Take 1 tablet (500 mg total) by mouth every 6 (six) hours as needed for muscle spasms.  . [DISCONTINUED] polyethylene glycol (MIRALAX / GLYCOLAX) packet Take 17 g by mouth 2 (two) times daily.   No facility-administered encounter medications on file as of 05/24/2019.    Activities of Daily Living In your present state of health, do you have any difficulty performing the following activities: 05/24/2019  Hearing? N  Vision? N  Difficulty concentrating or making decisions? N  Walking or climbing stairs? Y  Comment has had hip replacement  Dressing or bathing? N  Doing errands, shopping? N  Preparing Food and eating ? N  Using the Toilet? N  In the past six months, have you accidently leaked urine? Y  Comment wears penny pad  Do you have problems with loss of bowel control? N  Managing your Medications? N  Managing your Finances? N  Housekeeping or managing your Housekeeping? N  Some recent data might be hidden    Patient Care Team: Gayland Curry, DO as PCP - General (Geriatric Medicine) Gayland Curry, DO (Geriatric  Medicine) Jari Pigg, MD as Consulting Physician (Dermatology)    Assessment:   This is a routine wellness examination for Jeanne Webb.  Exercise Activities and Dietary recommendations Current Exercise Habits: Home exercise routine, Type of exercise: stretching, Time (Minutes): 30, Frequency (Times/Week): 3, Weekly Exercise (Minutes/Week): 90, Intensity: Moderate, Exercise limited by: Other - see comments(right hip replacement)  Goals    . Sit cross legged     Patient would like to be able to sit cross legged, patient will work on flexibility       Fall Risk Fall Risk  05/24/2019 01/06/2019 10/15/2017 07/21/2017 02/10/2017  Falls in the past year? 0 0 No No Yes  Comment - Emmi Telephone Survey: data to  providers prior to load - - -  Number falls in past yr: 0 - - - 1  Injury with Fall? 0 - - - No   Is the patient's home free of loose throw rugs in walkways, pet beds, electrical cords, etc?   no      Grab bars in the bathroom? yes      Handrails on the stairs?   no      Adequate lighting?   yes  Depression Screen PHQ 2/9 Scores 05/24/2019 10/15/2017 02/10/2017 01/24/2016  PHQ - 2 Score 0 0 0 0     Cognitive Function MMSE - Mini Mental State Exam 02/10/2017 01/24/2016 01/11/2015  Orientation to time 5 5 5   Orientation to Place 5 5 5   Registration 3 3 3   Attention/ Calculation 5 5 5   Recall 3 3 3   Language- name 2 objects 2 2 2   Language- repeat 1 1 1   Language- follow 3 step command 3 3 3   Language- read & follow direction 1 1 1   Write a sentence 1 1 1   Copy design 1 1 1   Total score 30 30 30      6CIT Screen 05/24/2019  What Year? 0 points  What month? 0 points  What time? 0 points  Count back from 20 0 points  Months in reverse 0 points  Repeat phrase 0 points  Total Score 0     There is no immunization history on file for this patient.  Qualifies for Shingles Vaccine? Decline   Screening Tests Health Maintenance  Topic Date Due  . COLONOSCOPY  05/23/2020 (Originally  11/29/1998)  . MAMMOGRAM  05/02/2021  . DEXA SCAN  Completed  . Hepatitis C Screening  Completed    Cancer Screenings: Lung: Low Dose CT Chest recommended if Age 61-80 years, 30 pack-year currently smoking OR have quit w/in 15years. Patient does not qualify. Breast:  Up to date on Mammogram? Yes   Up to date of Bone Density/Dexa? No Colorectal: Declined   Additional Screenings:  Hepatitis C Screening: completed.     Plan:  - declines PNA vaccine states does not take any vaccines. -Declined referral for colonoscopy and cologuard.   I have personally reviewed and noted the following in the patient's chart:   . Medical and social history . Use of alcohol, tobacco or illicit drugs  . Current medications and supplements . Functional ability and status . Nutritional status . Physical activity . Advanced directives . List of other physicians . Hospitalizations, surgeries, and ER visits in previous 12 months . Vitals . Screenings to include cognitive, depression, and falls . Referrals and appointments  In addition, I have reviewed and discussed with patient certain preventive protocols, quality metrics, and best practice recommendations. A written personalized care plan for preventive services as well as general preventive health recommendations were provided to patient.     Sandrea Hughs, NP  05/24/2019

## 2019-06-01 ENCOUNTER — Encounter: Payer: Self-pay | Admitting: Internal Medicine

## 2019-06-01 ENCOUNTER — Ambulatory Visit (INDEPENDENT_AMBULATORY_CARE_PROVIDER_SITE_OTHER): Payer: Medicare Other | Admitting: Internal Medicine

## 2019-06-01 ENCOUNTER — Other Ambulatory Visit: Payer: Self-pay

## 2019-06-01 VITALS — BP 120/72 | HR 83 | Temp 96.9°F | Ht 65.0 in | Wt 121.0 lb

## 2019-06-01 DIAGNOSIS — M25551 Pain in right hip: Secondary | ICD-10-CM

## 2019-06-01 DIAGNOSIS — E785 Hyperlipidemia, unspecified: Secondary | ICD-10-CM | POA: Diagnosis not present

## 2019-06-01 DIAGNOSIS — M810 Age-related osteoporosis without current pathological fracture: Secondary | ICD-10-CM

## 2019-06-01 DIAGNOSIS — S0990XS Unspecified injury of head, sequela: Secondary | ICD-10-CM

## 2019-06-01 DIAGNOSIS — Z96642 Presence of left artificial hip joint: Secondary | ICD-10-CM | POA: Diagnosis not present

## 2019-06-01 LAB — COMPLETE METABOLIC PANEL WITH GFR
AG Ratio: 1.9 (calc) (ref 1.0–2.5)
ALT: 17 U/L (ref 6–29)
AST: 22 U/L (ref 10–35)
Albumin: 4.3 g/dL (ref 3.6–5.1)
Alkaline phosphatase (APISO): 59 U/L (ref 37–153)
BUN: 11 mg/dL (ref 7–25)
CO2: 32 mmol/L (ref 20–32)
Calcium: 9.2 mg/dL (ref 8.6–10.4)
Chloride: 103 mmol/L (ref 98–110)
Creat: 0.65 mg/dL (ref 0.60–0.93)
GFR, Est African American: 104 mL/min/{1.73_m2} (ref >=60)
GFR, Est Non African American: 90 mL/min/{1.73_m2} (ref >=60)
Globulin: 2.3 g/dL (calc) (ref 1.9–3.7)
Glucose, Bld: 93 mg/dL (ref 65–99)
Potassium: 4 mmol/L (ref 3.5–5.3)
Sodium: 141 mmol/L (ref 135–146)
Total Bilirubin: 0.6 mg/dL (ref 0.2–1.2)
Total Protein: 6.6 g/dL (ref 6.1–8.1)

## 2019-06-01 LAB — CBC WITH DIFFERENTIAL/PLATELET
Absolute Monocytes: 302 cells/uL (ref 200–950)
Basophils Absolute: 103 cells/uL (ref 0–200)
Basophils Relative: 1.8 %
Eosinophils Absolute: 51 cells/uL (ref 15–500)
Eosinophils Relative: 0.9 %
HCT: 40.3 % (ref 35.0–45.0)
Hemoglobin: 13.8 g/dL (ref 11.7–15.5)
Lymphs Abs: 1060 cells/uL (ref 850–3900)
MCH: 31.5 pg (ref 27.0–33.0)
MCHC: 34.2 g/dL (ref 32.0–36.0)
MCV: 92 fL (ref 80.0–100.0)
MPV: 10.1 fL (ref 7.5–12.5)
Monocytes Relative: 5.3 %
Neutro Abs: 4184 cells/uL (ref 1500–7800)
Neutrophils Relative %: 73.4 %
Platelets: 286 10*3/uL (ref 140–400)
RBC: 4.38 10*6/uL (ref 3.80–5.10)
RDW: 11.4 % (ref 11.0–15.0)
Total Lymphocyte: 18.6 %
WBC: 5.7 10*3/uL (ref 3.8–10.8)

## 2019-06-01 LAB — LIPID PANEL
Cholesterol: 233 mg/dL — ABNORMAL HIGH (ref <200)
HDL: 102 mg/dL (ref >=50)
LDL Cholesterol (Calc): 115 mg/dL (calc) — ABNORMAL HIGH
Non-HDL Cholesterol (Calc): 131 mg/dL (calc) — ABNORMAL HIGH (ref <130)
Total CHOL/HDL Ratio: 2.3 (calc) (ref <5.0)
Triglycerides: 65 mg/dL (ref <150)

## 2019-06-01 NOTE — Patient Instructions (Signed)
Please bring us a copy of your living will and health care power of attorney.  

## 2019-06-01 NOTE — Progress Notes (Signed)
Location:  [facility] clinic Provider:  Venice Marcucci L. Mariea Clonts, D.O., C.M.D.  Goals of Care:  Advanced Directives 11/09/2017  Does Patient Have a Medical Advance Directive? Yes  Type of Advance Directive Living will  Does patient want to make changes to medical advance directive? No - Patient declined  Copy of [place] in Chart? -  Would patient like information on creating a medical advance directive? -  still not on file  Chief Complaint  Patient presents with  . Medical Management of Chronic Issues    re-establish care     HPI: Patient is a 68 y.o. female seen today for medical management of chronic diseases--she has not been seen for a year and a half and several quality metrics are outstanding.  Jeanne Webb elects not to do many of these items:  Colonoscopy, any vaccinations.  She has no problems with her bowels.  She has had her mammogram.  She did have her wellness visit on 05/24/19.  Last bone density was 2017 and consistent with senile osteoporosis so this should be repeated every 2 years.   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  Hurt her neck, side, right hip area.  She iced it a lot.  Went to chiropractor, Dr. Threasa Alpha, and he helped her get better.  She saw the PA from Dr. Alvan Dame and he recommended PT for her right hip which helped tremendously.     Her [place] Shepherd granddog knocked her on her surgical hip 6-8 wks in relation to the chandelier incident.    Past Medical History:  Diagnosis Date  . Allergy   . Arthritis   . Cramp of limb   . Plantar fascial fibromatosis     Past Surgical History:  Procedure Laterality Date  . DENTAL SURGERY    . TOTAL HIP ARTHROPLASTY Left 11/09/2017   Procedure: LEFT TOTAL HIP ARTHROPLASTY ANTERIOR APPROACH;  Surgeon: Paralee Cancel, MD;  Location: [facility];  Service: Orthopedics;   Laterality: Left;    Allergies  Allergen Reactions  . Water Oral [Alimentum] Anaphylaxis    Lips go numb, throat swelling... (able to  tolerate [place] water and [place])  . Lactose Intolerance (Gi)     Unknown reaction   . Nickel     Pain    . Other     Polyester- skin burns  Scented laundry detergent - eyes burn  Pepper spice- eyes burn Processed food    . Seldane [Terfenadine]     "knocked me out"  . Penicillins Rash    Has patient had a PCN reaction causing immediate rash, facial/tongue/throat swelling, SOB or lightheadedness with hypotension: Yes Has patient had a PCN reaction causing severe rash involving mucus membranes or skin necrosis: No Has patient had a PCN reaction that required hospitalization: No Has patient had a PCN reaction occurring within the last 10 years: No If all of the above answers are "NO", then may proceed with Cephalosporin use.     Outpatient Encounter Medications as of 06/01/2019  Medication Sig  . Ascorbic Acid (VITAMIN C) 1000 MG tablet Take 2,000 mg by mouth daily.  . Calcium-Magnesium-Zinc (CAL-MAG-ZINC PO) Take 2 tablets by mouth 2 (two) times daily.  . Cholecalciferol (VITAMIN D3) 5000 units CAPS Take 5,000 Units by mouth 2 (two) times daily.  . Folic Acid-Vit Q000111Q 123456 (  B COMPLEX-FOLIC ACID PO) Take 1 tablet by mouth daily.  . Nutritional Supplements (JUICE PLUS FIBRE PO) Take 2 tablets by mouth 2 (two) times daily.   . vitamin E 400 UNIT capsule Take 400 Units by mouth daily.    No facility-administered encounter medications on file as of 06/01/2019.    Review of Systems:  Review of Systems  Constitutional: Negative for chills, fever and malaise/fatigue.  HENT: Negative for congestion, hearing loss and sore throat.   Eyes: Negative for blurred vision.       Glasses  Respiratory: Negative for cough and shortness of breath.   Cardiovascular: Negative for chest pain, palpitations and leg swelling.  Gastrointestinal: Negative for  abdominal pain, blood in stool, constipation, diarrhea and melena.  Genitourinary: Negative for dysuria.  Musculoskeletal: Positive for falls.       Knocked down by dog, hit in head by chandelier  Skin: Negative for itching and rash.  Neurological: Negative for dizziness and loss of consciousness.  Endo/Heme/Allergies: Does not bruise/bleed easily.  Psychiatric/Behavioral: Negative for depression and memory loss. The patient is not nervous/anxious and does not have insomnia.     Health Maintenance  Topic Date Due  . COLONOSCOPY  05/23/2020 (Originally 11/29/1998)  . MAMMOGRAM  05/02/2021  . DEXA SCAN  Completed  . Hepatitis C Screening  Completed    Physical Exam: Vitals:   06/01/19 0810  Weight: 121 lb (54.9 kg)  Height: 5\' 5"  (1.651 m)   Body mass index is 20.14 kg/m. Physical Exam Vitals reviewed.  Constitutional:      General: She is not in acute distress.    Appearance: Normal appearance. She is normal weight. She is not ill-appearing or toxic-appearing.  HENT:     Head: Normocephalic and atraumatic.  Eyes:     Conjunctiva/sclera: Conjunctivae normal.     Comments: glasses  Cardiovascular:     Rate and Rhythm: Normal rate and regular rhythm.     Pulses: Normal pulses.     Heart sounds: Normal heart sounds.  Pulmonary:     Effort: Pulmonary effort is normal.     Breath sounds: Normal breath sounds. No wheezing, rhonchi or rales.  Abdominal:     General: Abdomen is flat.  Musculoskeletal:        General: Normal range of motion.  Skin:    General: Skin is warm and dry.  Neurological:     General: No focal deficit present.     Mental Status: She is alert and oriented to person, place, and time.  Psychiatric:        Mood and Affect: Mood normal.        Behavior: Behavior normal.     Labs reviewed: Basic Metabolic Panel: No results for input(s): NA, K, CL, CO2, GLUCOSE, BUN, CREATININE, CALCIUM, MG, PHOS, TSH in the last 8760 hours. Liver Function Tests: No  results for input(s): AST, ALT, ALKPHOS, BILITOT, PROT, ALBUMIN in the last 8760 hours. No results for input(s): LIPASE, AMYLASE in the last 8760 hours. No results for input(s): AMMONIA in the last 8760 hours. CBC: No results for input(s): WBC, NEUTROABS, HGB, HCT, MCV, PLT in the last 8760 hours. Lipid Panel: No results for input(s): CHOL, HDL, LDLCALC, TRIG, CHOLHDL, LDLDIRECT in the last 8760 hours. Lab Results  Component Value Date   HGBA1C 5.3 10/15/2017    Assessment/Plan 1. Hyperlipidemia, unspecified hyperlipidemia type - f/u labs before we meet -does follow a healthy diet, but has not been consistent with her exercise  regimen which was encouraged - CBC with Differential/Platelet - COMPLETE METABOLIC PANEL WITH GFR - Lipid panel  2. Status post left hip replacement -had imaging that indicated no injury from traumas she sustained  3. Senile osteoporosis -recommended that in a few months she follow-up with bone density due to prior osteoporosis found -continue vitamin supplements and get back on track with routine exercise regimen  4. Traumatic injury of head, sequela -seems she's had no long-term repercussions after being struck with chandelier that fell  5. Right hip pain -was after getting struck in head with chandelier and then also had fall when dog basically knocked her over -better after PT and chiropractor  Labs/tests ordered:   Lab Orders     CBC with Differential/Platelet     COMPLETE METABOLIC PANEL WITH GFR     Lipid panel --pt to come back for these asap b/c I ordered them unaware that phlebotomist was not here today and she had other plans and did not want to go to [address] [facility]  Next appt:  1 year for extended visit  Calhoun L. Aarion Kittrell, D.O. [facility] [address], [place] [address] Cell Phone (Mon-Fri 8am-5pm):  [phone] On Call:  [phone] & follow prompts after 5pm &  weekends Office Phone:  [phone] Office Fax:  [phone]

## 2019-06-05 ENCOUNTER — Encounter: Payer: Self-pay | Admitting: *Deleted

## 2019-06-05 ENCOUNTER — Other Ambulatory Visit: Payer: Medicare Other

## 2019-06-12 NOTE — Progress Notes (Signed)
Anemia she had last June has resolved.  Electrolytes and liver are normal.  Bad cholesterol is above goal (LDL) of 100--it's at 115.

## 2019-09-11 DIAGNOSIS — H2513 Age-related nuclear cataract, bilateral: Secondary | ICD-10-CM | POA: Diagnosis not present

## 2019-10-10 ENCOUNTER — Ambulatory Visit (INDEPENDENT_AMBULATORY_CARE_PROVIDER_SITE_OTHER): Payer: Medicare Other | Admitting: Sports Medicine

## 2019-10-10 ENCOUNTER — Encounter: Payer: Self-pay | Admitting: Sports Medicine

## 2019-10-10 ENCOUNTER — Other Ambulatory Visit: Payer: Self-pay

## 2019-10-10 VITALS — BP 122/68 | Ht 65.5 in | Wt 115.0 lb

## 2019-10-10 DIAGNOSIS — M21611 Bunion of right foot: Secondary | ICD-10-CM

## 2019-10-10 NOTE — Progress Notes (Signed)
   [facility] [address] [place],  [address] Phone: [phone] Fax: [phone]   Patient Name: Jeanne Webb Date of Birth: January 27, 1951 Medical Record Number: [id number] Gender: female Date of Encounter: 10/10/2019  SUBJECTIVE:      Chief Complaint:  Right foot pain   HPI:  Tecia is a 69 year old female presenting with multiple years of intermittent right foot pain.  She developed a bunion many years ago that has caused more discomfort.  She is able to perform all activities that she wants to do pain-free.  She is not very physically active.  A few years ago a chiropractor fitted her with custom orthotics that she wears.  She regularly wears cowboy boots as they offer the widest toe box and comfort for her.  The only related trauma to the foot is dropping a heavy object on the top of her lateral foot many years ago.  No numbness or tingling into her toes.  She purchased a over-the-counter bunion support brace that she brought with her today.  She did not bring her sneakers or orthotics that she normally wears.     ROS:     See HPI. No redness over bunion No night pain No pain with walking in some of her shoes   PERTINENT  PMH / PSH / FH / SH:  Past Medical, Surgical, Social, and Family History Reviewed & Updated in the EMR. Pertinent findings include:  Metatarsalgia, osteoarthritis of hip, left THA   OBJECTIVE:  BP 122/68   Ht 5' 5.5" (1.664 m)   Wt 115 lb (52.2 kg)   BMI 18.85 kg/m  Physical Exam:  Vital signs are reviewed.   GEN: Alert and oriented, NAD Pulm: Breathing unlabored PSY: normal mood, congruent affect  MSK: Right foot No swelling or erythema Full ROM at ankle and toes Full strength at ankle and toes Mild TTP over moderately large sized bunion with valgus shift Negative compression test Normal  medial arch Transverse arch flattened Callus formation on dorsal toes with significant second digit  hammertoe NVI   ASSESSMENT & PLAN:   1. Right foot bunion  Recommended she wear the brace she purchased over-the-counter more regularly.  She is going to drop off her orthotics that she wears regularly by the office so we can evaluate if she needs a new pair.  We also provided her with scaphoid pads that she can place into her cowboy boots for added comfort.  The fact she is not having much pain with the bunion makes [place] hesitant to pursue any surgical intervention at this point.   Lanier Clam, DO, ATC Sports Medicine Fellow  I observed and examined the patient with Dr. Kathrynn Speed and agree with assessment and plan.  Note reviewed and modified by me. Ila Mcgill, MD

## 2020-01-12 DIAGNOSIS — Z23 Encounter for immunization: Secondary | ICD-10-CM | POA: Diagnosis not present

## 2020-01-30 ENCOUNTER — Encounter: Payer: Self-pay | Admitting: Internal Medicine

## 2020-01-31 DIAGNOSIS — Z23 Encounter for immunization: Secondary | ICD-10-CM | POA: Diagnosis not present

## 2020-02-01 DIAGNOSIS — Z20822 Contact with and (suspected) exposure to covid-19: Secondary | ICD-10-CM | POA: Diagnosis not present

## 2020-02-08 IMAGING — DX DG PORTABLE PELVIS
1 series · 1 of 1 positions shown · non-contrast
Comparison: Portable exam 9625 hours compared intraoperative images
of 11/09/2017

CLINICAL DATA: Post LEFT total hip replacement

EXAM:
PORTABLE PELVIS 1-2 VIEWS

[pelvis ap]
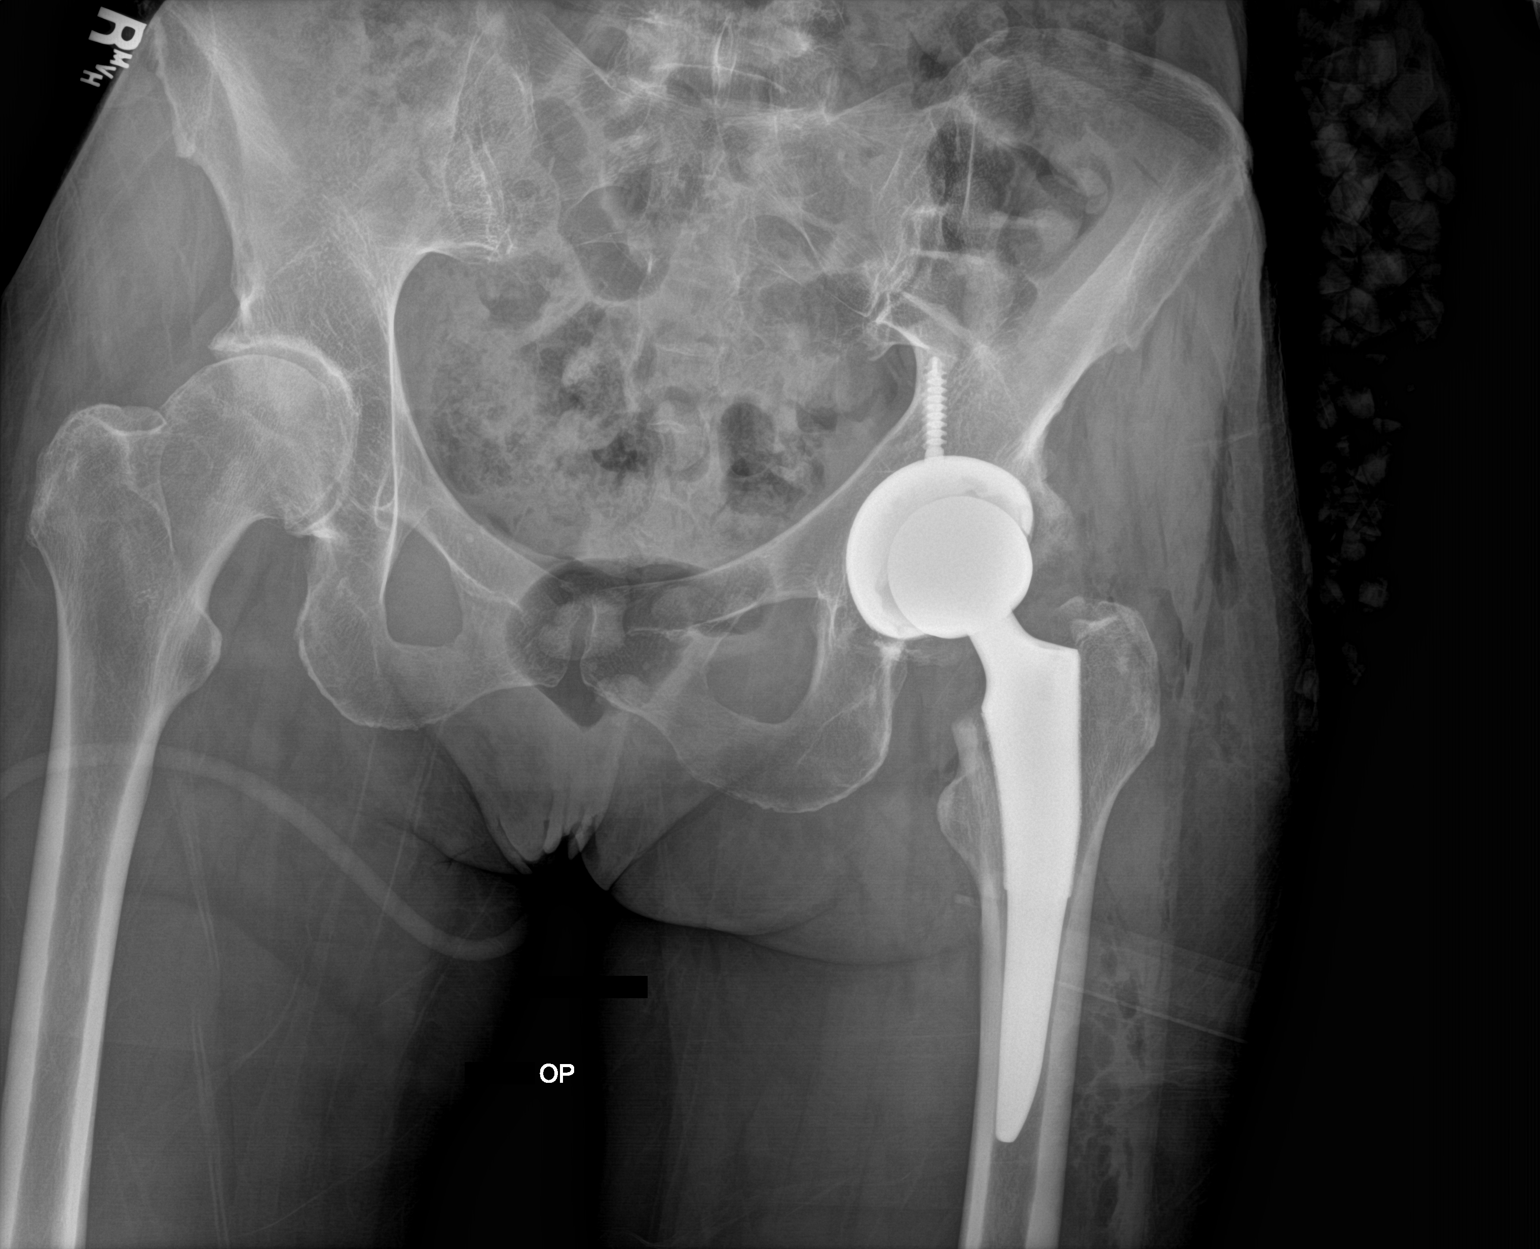

[1 of 1 positions shown; findings below may reference images not displayed]

FINDINGS: Diffuse osseous demineralization.

LEFT hip prosthesis identified without fracture or dislocation.

Degenerative changes of RIGHT hip joint noted.

Visualized pelvis intact.
IMPRESSION: LEFT hip prosthesis without acute complication.

Degenerative changes RIGHT hip joint.

## 2020-03-27 DIAGNOSIS — D485 Neoplasm of uncertain behavior of skin: Secondary | ICD-10-CM | POA: Diagnosis not present

## 2020-03-27 DIAGNOSIS — L723 Sebaceous cyst: Secondary | ICD-10-CM | POA: Diagnosis not present

## 2020-03-27 DIAGNOSIS — L309 Dermatitis, unspecified: Secondary | ICD-10-CM | POA: Diagnosis not present

## 2020-03-27 DIAGNOSIS — C44729 Squamous cell carcinoma of skin of left lower limb, including hip: Secondary | ICD-10-CM | POA: Diagnosis not present

## 2020-05-24 ENCOUNTER — Encounter: Payer: Medicare Other | Admitting: Family

## 2020-05-24 ENCOUNTER — Other Ambulatory Visit: Payer: Self-pay

## 2020-05-27 ENCOUNTER — Other Ambulatory Visit: Payer: Self-pay

## 2020-05-27 ENCOUNTER — Encounter: Payer: Self-pay | Admitting: Family

## 2020-05-27 ENCOUNTER — Telehealth: Payer: Self-pay

## 2020-05-27 ENCOUNTER — Ambulatory Visit (INDEPENDENT_AMBULATORY_CARE_PROVIDER_SITE_OTHER): Payer: Medicare Other | Admitting: Family

## 2020-05-27 DIAGNOSIS — Z0000 Encounter for general adult medical examination without abnormal findings: Secondary | ICD-10-CM

## 2020-05-27 DIAGNOSIS — Z1231 Encounter for screening mammogram for malignant neoplasm of breast: Secondary | ICD-10-CM | POA: Diagnosis not present

## 2020-05-27 DIAGNOSIS — Z780 Asymptomatic menopausal state: Secondary | ICD-10-CM | POA: Diagnosis not present

## 2020-05-27 NOTE — Patient Instructions (Signed)
[name] , Thank you for taking time to come for your Medicare Wellness Visit. I appreciate your ongoing commitment to your health goals. Please review the following plan we discussed and let me know if I can assist you in the future.   Screening recommendations/referrals: Colonoscopy: Declined  Mammogram: Ordered this visit [facility] will call you for appointment  Bone Density : Bone density ordered this visit [facility] will call you for appointment  Recommended yearly ophthalmology/optometry visit for glaucoma screening and checkup Recommended yearly dental visit for hygiene and checkup  Vaccinations: Influenza vaccine: Declined  Pneumococcal vaccine : Decline d Tdap vaccine: Declined  Shingles vaccine: Declined    Advanced directives: Yes   Conditions/risks identified: Advance Age female > 23 yrs,Hx of Smoking   Next appointment: 1 Year    Preventive Care 39 Years and Older, Female Preventive care refers to lifestyle choices and visits with your health care provider that can promote health and wellness. What does preventive care include?  A yearly physical exam. This is also called an annual well check.  Dental exams once or twice a year.  Routine eye exams. Ask your health care provider how often you should have your eyes checked.  Personal lifestyle choices, including:  Daily care of your teeth and gums.  Regular physical activity.  Eating a healthy diet.  Avoiding tobacco and drug use.  Limiting alcohol use.  Practicing safe sex.  Taking low-dose aspirin every day.  Taking vitamin and mineral supplements as recommended by your health care provider. What happens during an annual well check? The services and screenings done by your health care provider during your annual well check will depend on your age, overall health, lifestyle risk factors, and family history of disease. Counseling  Your health care provider may ask you questions about  your:  Alcohol use.  Tobacco use.  Drug use.  Emotional well-being.  Home and relationship well-being.  Sexual activity.  Eating habits.  History of falls.  Memory and ability to understand (cognition).  Work and work Statistician.  Reproductive health. Screening  You may have the following tests or measurements:  Height, weight, and BMI.  Blood pressure.  Lipid and cholesterol levels. These may be checked every 5 years, or more frequently if you are over 48 years old.  Skin check.  Lung cancer screening. You may have this screening every year starting at age 50 if you have a 30-pack-year history of smoking and currently smoke or have quit within the past 15 years.  Fecal occult blood test (FOBT) of the stool. You may have this test every year starting at age 26.  Flexible sigmoidoscopy or colonoscopy. You may have a sigmoidoscopy every 5 years or a colonoscopy every 10 years starting at age 48.  Hepatitis C blood test.  Hepatitis B blood test.  Sexually transmitted disease (STD) testing.  Diabetes screening. This is done by checking your blood sugar (glucose) after you have not eaten for a while (fasting). You may have this done every 1-3 years.  Bone density scan. This is done to screen for osteoporosis. You may have this done starting at age 61.  Mammogram. This may be done every 1-2 years. Talk to your health care provider about how often you should have regular mammograms. Talk with your health care provider about your test results, treatment options, and if necessary, the need for more tests. Vaccines  Your health care provider may recommend certain vaccines, such as:  Influenza vaccine. This  is recommended every year.  Tetanus, diphtheria, and acellular pertussis (Tdap, Td) vaccine. You may need a Td booster every 10 years.  Zoster vaccine. You may need this after age 5.  Pneumococcal 13-valent conjugate (PCV13) vaccine. One dose is recommended  after age 74.  Pneumococcal polysaccharide (PPSV23) vaccine. One dose is recommended after age 44. Talk to your health care provider about which screenings and vaccines you need and how often you need them. This information is not intended to replace advice given to you by your health care provider. Make sure you discuss any questions you have with your health care provider. Document Released: 06/21/2015 Document Revised: 02/12/2016 Document Reviewed: 03/26/2015 Elsevier Interactive Patient Education  2017 [place] Prevention in the Home Falls can cause injuries. They can happen to people of all ages. There are many things you can do to make your home safe and to help prevent falls. What can I do on the outside of my home?  Regularly fix the edges of walkways and driveways and fix any cracks.  Remove anything that might make you trip as you walk through a door, such as a raised step or threshold.  Trim any bushes or trees on the path to your home.  Use bright outdoor lighting.  Clear any walking paths of anything that might make someone trip, such as rocks or tools.  Regularly check to see if handrails are loose or broken. Make sure that both sides of any steps have handrails.  Any raised decks and porches should have guardrails on the edges.  Have any leaves, snow, or ice cleared regularly.  Use sand or salt on walking paths during winter.  Clean up any spills in your garage right away. This includes oil or grease spills. What can I do in the bathroom?  Use night lights.  Install grab bars by the toilet and in the tub and shower. Do not use towel bars as grab bars.  Use non-skid mats or decals in the tub or shower.  If you need to sit down in the shower, use a plastic, non-slip stool.  Keep the floor dry. Clean up any water that spills on the floor as soon as it happens.  Remove soap buildup in the tub or shower regularly.  Attach bath mats securely with  double-sided non-slip rug tape.  Do not have throw rugs and other things on the floor that can make you trip. What can I do in the bedroom?  Use night lights.  Make sure that you have a light by your bed that is easy to reach.  Do not use any sheets or blankets that are too big for your bed. They should not hang down onto the floor.  Have a firm chair that has side arms. You can use this for support while you get dressed.  Do not have throw rugs and other things on the floor that can make you trip. What can I do in the kitchen?  Clean up any spills right away.  Avoid walking on wet floors.  Keep items that you use a lot in easy-to-reach places.  If you need to reach something above you, use a strong step stool that has a grab bar.  Keep electrical cords out of the way.  Do not use floor polish or wax that makes floors slippery. If you must use wax, use non-skid floor wax.  Do not have throw rugs and other things on the floor that can make you  trip. What can I do with my stairs?  Do not leave any items on the stairs.  Make sure that there are handrails on both sides of the stairs and use them. Fix handrails that are broken or loose. Make sure that handrails are as long as the stairways.  Check any carpeting to make sure that it is firmly attached to the stairs. Fix any carpet that is loose or worn.  Avoid having throw rugs at the top or bottom of the stairs. If you do have throw rugs, attach them to the floor with carpet tape.  Make sure that you have a light switch at the top of the stairs and the bottom of the stairs. If you do not have them, ask someone to add them for you. What else can I do to help prevent falls?  Wear shoes that:  Do not have high heels.  Have rubber bottoms.  Are comfortable and fit you well.  Are closed at the toe. Do not wear sandals.  If you use a stepladder:  Make sure that it is fully opened. Do not climb a closed stepladder.  Make  sure that both sides of the stepladder are locked into place.  Ask someone to hold it for you, if possible.  Clearly mark and make sure that you can see:  Any grab bars or handrails.  First and last steps.  Where the edge of each step is.  Use tools that help you move around (mobility aids) if they are needed. These include:  Canes.  Walkers.  Scooters.  Crutches.  Turn on the lights when you go into a dark area. Replace any light bulbs as soon as they burn out.  Set up your furniture so you have a clear path. Avoid moving your furniture around.  If any of your floors are uneven, fix them.  If there are any pets around you, be aware of where they are.  Review your medicines with your doctor. Some medicines can make you feel dizzy. This can increase your chance of falling. Ask your doctor what other things that you can do to help prevent falls. This information is not intended to replace advice given to you by your health care provider. Make sure you discuss any questions you have with your health care provider. Document Released: 03/21/2009 Document Revised: 10/31/2015 Document Reviewed: 06/29/2014 Elsevier Interactive Patient Education  2017 [employer].

## 2020-05-27 NOTE — Progress Notes (Signed)
    This service is provided via telemedicine  No vital signs collected/recorded due to the encounter was a telemedicine visit.   Location of patient (ex: home, work): Home.   Patient consents to a telephone visit: Yes.  Location of the provider (ex: office, home):  [facility].  Name of any referring provider: Reed, Tiffany L, DO   Names of all persons participating in the telemedicine service and their role in the encounter: Patient, Kya Mayfield, RMA, Ngetich, Dinah, NP.    Time spent on call: 8 minutes spent on the phone with Medical Assistant.   

## 2020-05-27 NOTE — Progress Notes (Signed)
Subjective:   Jeanne Webb is a 69 y.o. female who presents for Medicare Annual (Subsequent) preventive examination.  Review of Systems    Cardiac Risk Factors include: advanced age (>62men, >20 women);smoking/ tobacco exposure     Objective:    There were no vitals filed for this visit. There is no height or weight on file to calculate BMI.  Advanced Directives 05/27/2020 06/01/2019 11/09/2017 11/02/2017 02/15/2017 02/10/2017 01/24/2016  Does Patient Have a Medical Advance Directive? Yes No Yes Yes Yes Yes No  Type of Advance Directive Living will;Healthcare Power of Attorney;Out of facility DNR (pink MOST or yellow form) - Living will Living will [place];Living will [place];Living will -  Does patient want to make changes to medical advance directive? No - Patient declined - No - Patient declined No - Patient declined - No - Patient declined -  Copy of [place] in Chart? No - copy requested - - - No - copy requested Yes -  Would patient like information on creating a medical advance directive? - Yes (Inpatient - patient defers creating a medical advance directive and declines information at this time) - - - - Yes - Educational materials given    Current Medications (verified) Outpatient Encounter Medications as of 05/27/2020  Medication Sig  . Ascorbic Acid (VITAMIN C) 1000 MG tablet Take 2,000 mg by mouth daily.  . Calcium-Magnesium-Zinc (CAL-MAG-ZINC PO) Take 2 tablets by mouth 2 (two) times daily.  . Cholecalciferol (VITAMIN D3) 5000 units CAPS Take 5,000 Units by mouth 2 (two) times daily.  . Nutritional Supplements (JUICE PLUS FIBRE PO) Take 2 tablets by mouth 2 (two) times daily.   . Probiotic Product (PROBIOTIC PO) Take 1 capsule by mouth 3 (three) times a week. 80 billion.  . vitamin E 400 UNIT capsule Take 400 Units by mouth daily.   . [DISCONTINUED] Folic Acid-Vit W5-IOE V03 (B COMPLEX-FOLIC ACID PO) Take 1 tablet  by mouth daily.   No facility-administered encounter medications on file as of 05/27/2020.    Allergies (verified) Water oral [alimentum], Lactose intolerance (gi), Nickel, Other, Seldane [terfenadine], and Penicillins   History: Past Medical History:  Diagnosis Date  . Allergy   . Arthritis   . Cramp of limb   . Plantar fascial fibromatosis    Past Surgical History:  Procedure Laterality Date  . DENTAL SURGERY    . PARTIAL HIP ARTHROPLASTY  11/2017   left hip replacement   . TOTAL HIP ARTHROPLASTY Left 11/09/2017   Procedure: LEFT TOTAL HIP ARTHROPLASTY ANTERIOR APPROACH;  Surgeon: Paralee Cancel, MD;  Location: [facility];  Service: Orthopedics;  Laterality: Left;   Family History  Problem Relation Age of Onset  . Cancer Mother        leukemia, ovarian, breast  . Stroke Father   . Heart disease Brother    Social History   Socioeconomic History  . Marital status: Married    Spouse name: Not on file  . Number of children: Not on file  . Years of education: Not on file  . Highest education level: Not on file  Occupational History  . Not on file  Tobacco Use  . Smoking status: Former Smoker    Years: 8.00    Types: Cigarettes  . Smokeless tobacco: Never Used  Vaping Use  . Vaping Use: Never used  Substance and Sexual Activity  . Alcohol use: No    Alcohol/week: 0.0 standard drinks  . Drug  use: No  . Sexual activity: Not Currently  Other Topics Concern  . Not on file  Social History Narrative  . Not on file   Social Determinants of Health   Financial Resource Strain: Not on file  Food Insecurity: Not on file  Transportation Needs: Not on file  Physical Activity: Not on file  Stress: Not on file  Social Connections: Not on file    Tobacco Counseling Counseling given: Not Answered   Clinical Intake:  Pre-visit preparation completed: No  Pain : No/denies pain     BMI - recorded: 20.14 Nutritional Status: BMI of 19-24  Normal Nutritional Risks:  None Diabetes: No  How often do you need to have someone help you when you read instructions, pamphlets, or other written materials from your doctor or pharmacy?: 1 - Never What is the last grade level you completed in school?: College  Diabetic? No  Interpreter Needed?: No  Information entered by :: Rashauna Tep FNP-C   Activities of Daily Living In your present state of health, do you have any difficulty performing the following activities: 05/27/2020  Hearing? N  Vision? N  Difficulty concentrating or making decisions? Y  Comment remembering  Walking or climbing stairs? Y  Comment post hip replacement  Dressing or bathing? N  Doing errands, shopping? N  Preparing Food and eating ? N  Using the Toilet? N  In the past six months, have you accidently leaked urine? Y  Comment wears pad all the time  Do you have problems with loss of bowel control? N  Managing your Medications? N  Managing your Finances? N  Housekeeping or managing your Housekeeping? N  Some recent data might be hidden    Patient Care Team: Gayland Curry, DO as PCP - General (Geriatric Medicine) Gayland Curry, DO (Geriatric Medicine) Jari Pigg, MD as Consulting Physician (Dermatology)  Indicate any recent Medical Services you may have received from other than [facility] providers in the past year (date may be approximate).     Assessment:   This is a routine wellness examination for Jeanne Webb.  Hearing/Vision screen  Hearing Screening   125Hz  250Hz  500Hz  1000Hz  2000Hz  3000Hz  4000Hz  6000Hz  8000Hz   Right ear:           Left ear:           Comments: No Hearing Concerns.   Vision Screening Comments: No Vision Concerns. Patient wears prescription glasses.   Dietary issues and exercise activities discussed: Current Exercise Habits: Home exercise routine, Type of exercise: stretching, Time (Minutes): 15, Frequency (Times/Week): 7, Weekly Exercise (Minutes/Week): 105, Intensity: Mild, Exercise limited by:  None identified  Goals    . Sit cross legged     Patient would like to be able to sit cross legged, patient will work on flexibility      Depression Screen PHQ 2/9 Scores 05/27/2020 06/01/2019 05/24/2019 10/15/2017 02/10/2017 01/24/2016 10/01/2015  PHQ - 2 Score 0 0 0 0 0 0 0    Fall Risk Fall Risk  05/27/2020 06/01/2019 05/24/2019 01/06/2019 10/15/2017  Falls in the past year? 1 1 0 0 No  Comment Tripped while walking fast with Dog. States that her face was bruised up. - - Emmi Telephone Survey: data to providers prior to load -  Number falls in past yr: 0 0 0 - -  Injury with Fall? 1 0 0 - -    FALL RISK PREVENTION PERTAINING TO THE HOME:  Any stairs in or around the home? No  If so, are there any without handrails? No  Home free of loose throw rugs in walkways, pet beds, electrical cords, etc? No  Adequate lighting in your home to reduce risk of falls? Yes   ASSISTIVE DEVICES UTILIZED TO PREVENT FALLS:  Life alert? No  Use of a cane, walker or w/c? No  Grab bars in the bathroom? No  Shower chair or bench in shower? No  Elevated toilet seat or a handicapped toilet? No   TIMED UP AND GO:  Was the test performed? No .  Length of time to ambulate 10 feet: N/A  sec.   Gait steady and fast without use of assistive device  Cognitive Function: MMSE - Mini Mental State Exam 02/10/2017 01/24/2016 01/11/2015  Orientation to time 5 5 5   Orientation to Place 5 5 5   Registration 3 3 3   Attention/ Calculation 5 5 5   Recall 3 3 3   Language- name 2 objects 2 2 2   Language- repeat 1 1 1   Language- follow 3 step command 3 3 3   Language- read & follow direction 1 1 1   Write a sentence 1 1 1   Copy design 1 1 1   Total score 30 30 30      6CIT Screen 05/27/2020 05/24/2019  What Year? 0 points 0 points  What month? 0 points 0 points  What time? 0 points 0 points  Count back from 20 0 points 0 points  Months in reverse 0 points 0 points  Repeat phrase 0 points 0 points  Total Score 0 0     Immunizations Immunization History  Administered Date(s) Administered  . PFIZER SARS-COV-2 Vaccination 01/12/2020, 01/31/2020    TDAP status: Due, Education has been provided regarding the importance of this vaccine. Advised may receive this vaccine at local pharmacy or Health Dept. Aware to provide a copy of the vaccination record if obtained from local pharmacy or Health Dept. Verbalized acceptance and understanding.  Flu Vaccine status: Declined, Education has been provided regarding the importance of this vaccine but patient still declined. Advised may receive this vaccine at local pharmacy or Health Dept. Aware to provide a copy of the vaccination record if obtained from local pharmacy or Health Dept. Verbalized acceptance and understanding.  Pneumococcal vaccine status: Declined,  Education has been provided regarding the importance of this vaccine but patient still declined. Advised may receive this vaccine at local pharmacy or Health Dept. Aware to provide a copy of the vaccination record if obtained from local pharmacy or Health Dept. Verbalized acceptance and understanding.   Covid-19 vaccine status: Completed vaccines  Qualifies for Shingles Vaccine? No   Zostavax completed No   Shingrix Completed?: No.    Education has been provided regarding the importance of this vaccine. Patient has been advised to call insurance company to determine out of pocket expense if they have not yet received this vaccine. Advised may also receive vaccine at local pharmacy or Health Dept. Verbalized acceptance and understanding.  Screening Tests Health Maintenance  Topic Date Due  . COLONOSCOPY  Never done  . COVID-19 Vaccine (3 - Booster for Pfizer series) 08/02/2020  . MAMMOGRAM  05/02/2021  . DEXA SCAN  Completed  . Hepatitis C Screening  Completed    Health Maintenance  Health Maintenance Due  Topic Date Due  . COLONOSCOPY  Never done    Declined Colonoscopy   Mammogram status:  Ordered 05/27/2020 . Pt provided with contact info and advised to call to schedule appt.   Bone Density  status: Ordered 05/27/2020 . Pt provided with contact info and advised to call to schedule appt.  Lung Cancer Screening: (Low Dose CT Chest recommended if Age 62-80 years, 30 pack-year currently smoking OR have quit w/in 15years.) does qualify.   Lung Cancer Screening Referral: Declined   Additional Screening:  Hepatitis C Screening: does not qualify; Completed yes   Vision Screening: Recommended annual ophthalmology exams for early detection of glaucoma and other disorders of the eye. Is the patient up to date with their annual eye exam?  Yes  Who is the provider or what is the name of the office in which the patient attends annual eye exams? Dr.McCune Hildred Alamin  If pt is not established with a provider, would they like to be referred to a provider to establish care? No .   Dental Screening: Recommended annual dental exams for proper oral hygiene  Community Resource Referral / Chronic Care Management: CRR required this visit?  No   CCM required this visit?  No     Plan:    - Mammogram - Dexa Scan   I have personally reviewed and noted the following in the patient's chart:   . Medical and social history . Use of alcohol, tobacco or illicit drugs  . Current medications and supplements . Functional ability and status . Nutritional status . Physical activity . Advanced directives . List of other physicians . Hospitalizations, surgeries, and ER visits in previous 12 months . Vitals . Screenings to include cognitive, depression, and falls . Referrals and appointments  In addition, I have reviewed and discussed with patient certain preventive protocols, quality metrics, and best practice recommendations. A written personalized care plan for preventive services as well as general preventive health recommendations were provided to patient.   Sandrea Hughs, NP   05/27/2020    Nurse Notes:Mammogram and Bone density ordered this visit to be done at [facility] per patient's request.

## 2020-05-27 NOTE — Telephone Encounter (Signed)
Ms. mianna, iezzi are scheduled for a virtual visit with your provider today.    Just as we do with appointments in the office, we must obtain your consent to participate.  Your consent will be active for this visit and any virtual visit you may have with one of our providers in the next 365 days.    If you have a MyChart account, I can also send a copy of this consent to you electronically.  All virtual visits are billed to your insurance company just like a traditional visit in the office.  As this is a virtual visit, video technology does not allow for your provider to perform a traditional examination.  This may limit your provider's ability to fully assess your condition.  If your provider identifies any concerns that need to be evaluated in person or the need to arrange testing such as labs, EKG, etc, we will make arrangements to do so.    Although advances in technology are sophisticated, we cannot ensure that it will always work on either your end or our end.  If the connection with a video visit is poor, we may have to switch to a telephone visit.  With either a video or telephone visit, we are not always able to ensure that we have a secure connection.   I need to obtain your verbal consent now.   Are you willing to proceed with your visit today?   KELBY LOTSPEICH has provided verbal consent on 05/27/2020 for a virtual visit (video or telephone).   [place], [place] 05/27/2020  8:30 AM

## 2020-06-20 DIAGNOSIS — L905 Scar conditions and fibrosis of skin: Secondary | ICD-10-CM | POA: Diagnosis not present

## 2020-06-20 DIAGNOSIS — C44729 Squamous cell carcinoma of skin of left lower limb, including hip: Secondary | ICD-10-CM | POA: Diagnosis not present

## 2020-06-27 ENCOUNTER — Encounter: Payer: Self-pay | Admitting: *Deleted

## 2020-06-27 DIAGNOSIS — Z1231 Encounter for screening mammogram for malignant neoplasm of breast: Secondary | ICD-10-CM | POA: Diagnosis not present

## 2020-06-27 DIAGNOSIS — M810 Age-related osteoporosis without current pathological fracture: Secondary | ICD-10-CM | POA: Diagnosis not present

## 2020-06-27 DIAGNOSIS — M8588 Other specified disorders of bone density and structure, other site: Secondary | ICD-10-CM | POA: Diagnosis not present

## 2020-06-27 DIAGNOSIS — Z780 Asymptomatic menopausal state: Secondary | ICD-10-CM | POA: Diagnosis not present

## 2020-06-27 LAB — HM MAMMOGRAPHY

## 2020-06-27 LAB — HM DEXA SCAN

## 2020-07-01 ENCOUNTER — Encounter: Payer: Self-pay | Admitting: *Deleted

## 2020-07-16 ENCOUNTER — Telehealth: Payer: Self-pay | Admitting: Family

## 2020-07-16 NOTE — Telephone Encounter (Signed)
Noted  

## 2020-07-16 NOTE — Telephone Encounter (Signed)
Called patient and discussed results. Patient has upcoming appointment with Gayland Curry, DO on 08/05/2020 at 1:30pm.

## 2020-07-16 NOTE — Telephone Encounter (Signed)
Called patient and no answer. Voicemail was left with office call back number.   

## 2020-07-16 NOTE — Telephone Encounter (Signed)
Bone density results received indicates Osteoporosis on left radius and right femur neck.Also Osteopenia on right total femur and AP total spine has worsen compared to previous Bone density done 12/27/20217 bone density.I noticed you have not seen Dr.Reed since last visit 06/01/2019.Please schedule an appointment with Dr.Reed for your Routine follow up visit and discuss Osteoporosis treatment option.

## 2020-07-29 ENCOUNTER — Encounter: Payer: Self-pay | Admitting: Internal Medicine

## 2020-08-05 ENCOUNTER — Ambulatory Visit (INDEPENDENT_AMBULATORY_CARE_PROVIDER_SITE_OTHER): Payer: Medicare Other | Admitting: Internal Medicine

## 2020-08-05 ENCOUNTER — Encounter: Payer: Self-pay | Admitting: Internal Medicine

## 2020-08-05 ENCOUNTER — Other Ambulatory Visit: Payer: Self-pay

## 2020-08-05 VITALS — BP 120/70 | HR 72 | Temp 98.0°F | Ht 65.0 in | Wt 116.0 lb

## 2020-08-05 DIAGNOSIS — S01511A Laceration without foreign body of lip, initial encounter: Secondary | ICD-10-CM

## 2020-08-05 DIAGNOSIS — M25551 Pain in right hip: Secondary | ICD-10-CM

## 2020-08-05 DIAGNOSIS — M810 Age-related osteoporosis without current pathological fracture: Secondary | ICD-10-CM | POA: Diagnosis not present

## 2020-08-05 DIAGNOSIS — H8110 Benign paroxysmal vertigo, unspecified ear: Secondary | ICD-10-CM | POA: Diagnosis not present

## 2020-08-05 NOTE — Progress Notes (Signed)
Location:  [facility] clinic Provider:  Fintan Grater L. Mariea Clonts, D.O., C.M.D.  Goals of Care:  Advanced Directives 08/05/2020  Does Patient Have a Medical Advance Directive? Yes  Type of Paramedic of [place];Living will  Does patient want to make changes to medical advance directive? No - Patient declined  Copy of [place] in Chart? No - copy requested  Would patient like information on creating a medical advance directive? -     Chief Complaint  Patient presents with  . Medical Management of Chronic Issues    Follow up. Discuss bone density. Patient had bruising on left side of chin and lip. Patient was walking up the steps and german shepard ran up under and her teeth went through her lip. Neighbor who is a doctor stitched it up. Patient would like to discuss right hip, hand and head.  Discuss need for colonoscopy and COVID booster.    HPI: Patient is a 70 y.o. female seen today for medical management of chronic diseases.    She had a fall on stains when a german shepherd tripped her and dog hit her chin--neighbor sutured her up.  Right hip hurts afterward and right side of head.    Hands are getting stiff.    Needs cscope but has repeatedly declined.  Is willing if a problem ensues.    She had a bone density and we need to review the results today:  Osteoporosis.  She wants to try to get back to exercising--recommended a mix of weightbearing walking or cycling, strengthening and balance exercises.  She declines medication due to her allergies and if she would consider taking one, she'd have to have a sample to hold in front of her to see if she fell forward or back to indicate if she was allergic.    Past Medical History:  Diagnosis Date  . Allergy   . Arthritis   . Cramp of limb   . Plantar fascial fibromatosis     Past Surgical History:  Procedure Laterality Date  . DENTAL SURGERY    . PARTIAL HIP ARTHROPLASTY  11/2017   left hip  replacement   . squamous cell      Removal from leg  . TOTAL HIP ARTHROPLASTY Left 11/09/2017   Procedure: LEFT TOTAL HIP ARTHROPLASTY ANTERIOR APPROACH;  Surgeon: Paralee Cancel, MD;  Location: [facility];  Service: Orthopedics;  Laterality: Left;    Allergies  Allergen Reactions  . Water Oral [Alimentum] Anaphylaxis    Lips go numb, throat swelling... (able to  tolerate [place] water and [place])  . Lactose Intolerance (Gi)     Unknown reaction   . Nickel     Pain    . Other     Polyester- skin burns  Scented laundry detergent - eyes burn  Pepper spice- eyes burn Processed food    . Seldane [Terfenadine]     "knocked me out"  . Penicillins Rash    Has patient had a PCN reaction causing immediate rash, facial/tongue/throat swelling, SOB or lightheadedness with hypotension: Yes Has patient had a PCN reaction causing severe rash involving mucus membranes or skin necrosis: No Has patient had a PCN reaction that required hospitalization: No Has patient had a PCN reaction occurring within the last 10 years: No If all of the above answers are "NO", then may proceed with Cephalosporin use.     Outpatient Encounter Medications as of 08/05/2020  Medication Sig  . Ascorbic Acid (VITAMIN C) 1000  MG tablet Take 2,000 mg by mouth daily.  . Calcium-Magnesium-Zinc (CAL-MAG-ZINC PO) Take 2 tablets by mouth 2 (two) times daily.  . Cholecalciferol (VITAMIN D3) 5000 units CAPS Take 5,000 Units by mouth 2 (two) times daily.  . Nutritional Supplements (JUICE PLUS FIBRE PO) Take 2 tablets by mouth 2 (two) times daily.   . Probiotic Product (PROBIOTIC PO) Take 1 capsule by mouth 3 (three) times a week. 80 billion.  . vitamin E 400 UNIT capsule Take 400 Units by mouth daily.   . Zinc 40 MG TABS Take by mouth.   No facility-administered encounter medications on file as of 08/05/2020.    Review of Systems:  Review of Systems  Constitutional: Negative for chills and fever.  HENT: Negative for  congestion, hearing loss and sore throat.        Lower lip laceration  Eyes: Negative for blurred vision.       Glasses  Respiratory: Negative for cough and shortness of breath.   Cardiovascular: Negative for chest pain, palpitations and leg swelling.  Gastrointestinal: Negative for abdominal pain and constipation.  Genitourinary: Negative for dysuria.  Musculoskeletal: Positive for joint pain. Negative for falls.       Right hip pain in joint  Skin:       Ecchymoses left face/lip/in mouth    Health Maintenance  Topic Date Due  . COLONOSCOPY (Pts 45-85yrs Insurance coverage will need to be confirmed)  Never done  . COVID-19 Vaccine (3 - Booster for Pfizer series) 08/02/2020  . MAMMOGRAM  06/27/2022  . DEXA SCAN  Completed  . Hepatitis C Screening  Completed    Physical Exam: Vitals:   08/05/20 1409  BP: 120/70  Pulse: 72  Temp: 98 F (36.7 C)  SpO2: 98%  Weight: 116 lb (52.6 kg)  Height: 5\' 5"  (1.651 m)   Body mass index is 19.3 kg/m. Physical Exam Vitals reviewed.  Constitutional:      Appearance: Normal appearance.  HENT:     Head: Normocephalic and atraumatic.     Right Ear: External ear normal.     Left Ear: External ear normal.     Nose: Nose normal.     Mouth/Throat:     Pharynx: Oropharynx is clear.     Comments: Laceration of left lower lip with sutures from teeth Eyes:     Extraocular Movements: Extraocular movements intact.     Conjunctiva/sclera: Conjunctivae normal.     Pupils: Pupils are equal, round, and reactive to light.     Comments: glasses  Cardiovascular:     Rate and Rhythm: Normal rate and regular rhythm.     Pulses: Normal pulses.     Heart sounds: Normal heart sounds.  Pulmonary:     Effort: Pulmonary effort is normal.     Breath sounds: No wheezing, rhonchi or rales.  Abdominal:     General: Bowel sounds are normal.     Palpations: Abdomen is soft.  Musculoskeletal:        General: Normal range of motion.     Cervical back:  Neck supple.     Right lower leg: No edema.     Left lower leg: No edema.  Lymphadenopathy:     Cervical: No cervical adenopathy.  Neurological:     General: No focal deficit present.     Mental Status: She is alert and oriented to person, place, and time.     Motor: No weakness.     Gait: Gait normal.  Deep Tendon Reflexes: Reflexes normal.     Comments: Right hip tenderness   Psychiatric:        Mood and Affect: Mood normal.     Labs reviewed: Basic Metabolic Panel: No results for input(s): NA, K, CL, CO2, GLUCOSE, BUN, CREATININE, CALCIUM, MG, PHOS, TSH in the last 8760 hours. Liver Function Tests: No results for input(s): AST, ALT, ALKPHOS, BILITOT, PROT, ALBUMIN in the last 8760 hours. No results for input(s): LIPASE, AMYLASE in the last 8760 hours. No results for input(s): AMMONIA in the last 8760 hours. CBC: No results for input(s): WBC, NEUTROABS, HGB, HCT, MCV, PLT in the last 8760 hours. Lipid Panel: No results for input(s): CHOL, HDL, LDLCALC, TRIG, CHOLHDL, LDLDIRECT in the last 8760 hours. Lab Results  Component Value Date   HGBA1C 5.3 10/15/2017    Procedures since last visit: No results found.  Assessment/Plan 1. Senile osteoporosis -recommended evenity therapy due to declining bone density  -she declines and prefers to get back on track with her exercise regimen -cont vitamin D3  2. Laceration of lower lip, initial encounter -appears to be recovering well and has f/u for suture removal  3. Benign paroxysmal positional vertigo, unspecified laterality -I neglected to give her the epley maneuver instructions during the visit but they will be in mychart and we can mail them if needed  4. Right hip pain -likely her OA flared up a bit after her run in with the dog -get back to regular exercise to help ward off OA stiffness and discomfort here and in hands  Labs/tests ordered: none Next appt:  1 yr cpe   Kymia Simi L. Calandria Mullings, D.O. [facility] [address], [place] [address] Cell Phone (Mon-Fri 8am-5pm):  [phone] On Call:  [phone] & follow prompts after 5pm & weekends Office Phone:  [phone] Office Fax:  [phone]

## 2020-08-05 NOTE — Patient Instructions (Addendum)
Evenity injections for osteoporosis  How to Perform the Epley Maneuver The Epley maneuver is an exercise that relieves symptoms of vertigo. Vertigo is the feeling that you or your surroundings are moving when they are not. When you feel vertigo, you may feel like the room is spinning and may have trouble walking. The Epley maneuver is used for a type of vertigo caused by a calcium deposit in a part of the inner ear. The maneuver involves changing head positions to help the deposit move out of the area. You can do this maneuver at home whenever you have symptoms of vertigo. You can repeat it in 24 hours if your vertigo has not gone away. Even though the Epley maneuver may relieve your vertigo for a few weeks, it is possible that your symptoms will return. This maneuver relieves vertigo, but it does not relieve dizziness. What are the risks? If it is done correctly, the Epley maneuver is considered safe. Sometimes it can lead to dizziness or nausea that goes away after a short time. If you develop other symptoms--such as changes in vision, weakness, or numbness--stop doing the maneuver and call your health care provider. Supplies needed:  A bed or table.  A pillow. How to do the Epley maneuver 1. Sit on the edge of a bed or table with your back straight and your legs extended or hanging over the edge of the bed or table. 2. Turn your head halfway toward the affected ear or side as told by your health care provider. 3. Lie backward quickly with your head turned until you are lying flat on your back. You may want to position a pillow under your shoulders. 4. Hold this position for at least 30 seconds. If you feel dizzy or have symptoms of vertigo, continue to hold the position until the symptoms stop. 5. Turn your head to the opposite direction until your unaffected ear is facing the floor. 6. Hold this position for at least 30 seconds. If you feel dizzy or have symptoms of vertigo, continue to hold  the position until the symptoms stop. 7. Turn your whole body to the same side as your head so that you are positioned on your side. Your head will now be nearly facedown. Hold for at least 30 seconds. If you feel dizzy or have symptoms of vertigo, continue to hold the position until the symptoms stop. 8. Sit back up. You can repeat the maneuver in 24 hours if your vertigo does not go away.      Follow these instructions at home: For 24 hours after doing the Epley maneuver:  Keep your head in an upright position.  When lying down to sleep or rest, keep your head raised (elevated) with two or more pillows.  Avoid excessive neck movements. Activity  Do not drive or use machinery if you feel dizzy.  After doing the Epley maneuver, return to your normal activities as told by your health care provider. Ask your health care provider what activities are safe for you. General instructions  Drink enough fluid to keep your urine pale yellow.  Do not drink alcohol.  Take over-the-counter and prescription medicines only as told by your health care provider.  Keep all follow-up visits as told by your health care provider. This is important. Preventing vertigo symptoms Ask your health care provider if there is anything you should do at home to prevent vertigo. He or she may recommend that you:  Keep your head elevated with two or  more pillows while you sleep.  Do not sleep on the side of your affected ear.  Get up slowly from bed.  Avoid sudden movements during the day.  Avoid extreme head positions or movement, such as looking up or bending over. Contact a health care provider if:  Your vertigo gets worse.  You have other symptoms, including: ? Nausea. ? Vomiting. ? Headache. Get help right away if you:  Have vision changes.  Have a headache or neck pain that is severe or getting worse.  Cannot stop vomiting.  Have new numbness or weakness in any part of your  body. Summary  Vertigo is the feeling that you or your surroundings are moving when they are not.  The Epley maneuver is an exercise that relieves symptoms of vertigo.  If the Epley maneuver is done correctly, it is considered safe and relieves vertigo quickly. This information is not intended to replace advice given to you by your health care provider. Make sure you discuss any questions you have with your health care provider. Document Revised: 03/22/2019 Document Reviewed: 03/22/2019 Elsevier Patient Education  2021 [employer].

## 2020-08-07 ENCOUNTER — Ambulatory Visit: Payer: Self-pay | Admitting: Internal Medicine

## 2020-09-03 DIAGNOSIS — M9901 Segmental and somatic dysfunction of cervical region: Secondary | ICD-10-CM | POA: Diagnosis not present

## 2020-09-03 DIAGNOSIS — M9902 Segmental and somatic dysfunction of thoracic region: Secondary | ICD-10-CM | POA: Diagnosis not present

## 2020-09-03 DIAGNOSIS — M542 Cervicalgia: Secondary | ICD-10-CM | POA: Diagnosis not present

## 2020-09-03 DIAGNOSIS — M9903 Segmental and somatic dysfunction of lumbar region: Secondary | ICD-10-CM | POA: Diagnosis not present

## 2020-09-06 DIAGNOSIS — M9903 Segmental and somatic dysfunction of lumbar region: Secondary | ICD-10-CM | POA: Diagnosis not present

## 2020-09-06 DIAGNOSIS — M542 Cervicalgia: Secondary | ICD-10-CM | POA: Diagnosis not present

## 2020-09-06 DIAGNOSIS — M9902 Segmental and somatic dysfunction of thoracic region: Secondary | ICD-10-CM | POA: Diagnosis not present

## 2020-09-06 DIAGNOSIS — M9901 Segmental and somatic dysfunction of cervical region: Secondary | ICD-10-CM | POA: Diagnosis not present

## 2020-09-09 ENCOUNTER — Telehealth: Payer: Self-pay

## 2020-09-09 DIAGNOSIS — M542 Cervicalgia: Secondary | ICD-10-CM | POA: Diagnosis not present

## 2020-09-09 DIAGNOSIS — M9903 Segmental and somatic dysfunction of lumbar region: Secondary | ICD-10-CM | POA: Diagnosis not present

## 2020-09-09 DIAGNOSIS — M9901 Segmental and somatic dysfunction of cervical region: Secondary | ICD-10-CM | POA: Diagnosis not present

## 2020-09-09 DIAGNOSIS — M9902 Segmental and somatic dysfunction of thoracic region: Secondary | ICD-10-CM | POA: Diagnosis not present

## 2020-09-09 NOTE — Telephone Encounter (Signed)
Patient called wanting message sent to Dr. Hollace Kinnier. I informed patient that Dr. Mariea Clonts was no longer with our practice and that Marlowe Sax, NP is now her PCP, she then stated that she would just hold off on the message.

## 2020-09-11 DIAGNOSIS — M9902 Segmental and somatic dysfunction of thoracic region: Secondary | ICD-10-CM | POA: Diagnosis not present

## 2020-09-11 DIAGNOSIS — M9903 Segmental and somatic dysfunction of lumbar region: Secondary | ICD-10-CM | POA: Diagnosis not present

## 2020-09-11 DIAGNOSIS — M9901 Segmental and somatic dysfunction of cervical region: Secondary | ICD-10-CM | POA: Diagnosis not present

## 2020-09-11 DIAGNOSIS — M542 Cervicalgia: Secondary | ICD-10-CM | POA: Diagnosis not present

## 2020-09-13 DIAGNOSIS — M9901 Segmental and somatic dysfunction of cervical region: Secondary | ICD-10-CM | POA: Diagnosis not present

## 2020-09-13 DIAGNOSIS — M9903 Segmental and somatic dysfunction of lumbar region: Secondary | ICD-10-CM | POA: Diagnosis not present

## 2020-09-13 DIAGNOSIS — M9902 Segmental and somatic dysfunction of thoracic region: Secondary | ICD-10-CM | POA: Diagnosis not present

## 2020-09-13 DIAGNOSIS — M542 Cervicalgia: Secondary | ICD-10-CM | POA: Diagnosis not present

## 2020-09-16 DIAGNOSIS — M9901 Segmental and somatic dysfunction of cervical region: Secondary | ICD-10-CM | POA: Diagnosis not present

## 2020-09-16 DIAGNOSIS — M9902 Segmental and somatic dysfunction of thoracic region: Secondary | ICD-10-CM | POA: Diagnosis not present

## 2020-09-16 DIAGNOSIS — M9903 Segmental and somatic dysfunction of lumbar region: Secondary | ICD-10-CM | POA: Diagnosis not present

## 2020-09-16 DIAGNOSIS — M542 Cervicalgia: Secondary | ICD-10-CM | POA: Diagnosis not present

## 2020-09-18 DIAGNOSIS — M542 Cervicalgia: Secondary | ICD-10-CM | POA: Diagnosis not present

## 2020-09-18 DIAGNOSIS — M9903 Segmental and somatic dysfunction of lumbar region: Secondary | ICD-10-CM | POA: Diagnosis not present

## 2020-09-18 DIAGNOSIS — M9901 Segmental and somatic dysfunction of cervical region: Secondary | ICD-10-CM | POA: Diagnosis not present

## 2020-09-18 DIAGNOSIS — M9902 Segmental and somatic dysfunction of thoracic region: Secondary | ICD-10-CM | POA: Diagnosis not present

## 2020-09-23 DIAGNOSIS — M542 Cervicalgia: Secondary | ICD-10-CM | POA: Diagnosis not present

## 2020-09-23 DIAGNOSIS — M9901 Segmental and somatic dysfunction of cervical region: Secondary | ICD-10-CM | POA: Diagnosis not present

## 2020-09-23 DIAGNOSIS — M9903 Segmental and somatic dysfunction of lumbar region: Secondary | ICD-10-CM | POA: Diagnosis not present

## 2020-09-23 DIAGNOSIS — M9902 Segmental and somatic dysfunction of thoracic region: Secondary | ICD-10-CM | POA: Diagnosis not present

## 2020-09-24 DIAGNOSIS — D3131 Benign neoplasm of right choroid: Secondary | ICD-10-CM | POA: Diagnosis not present

## 2020-09-25 DIAGNOSIS — M9901 Segmental and somatic dysfunction of cervical region: Secondary | ICD-10-CM | POA: Diagnosis not present

## 2020-09-25 DIAGNOSIS — M9902 Segmental and somatic dysfunction of thoracic region: Secondary | ICD-10-CM | POA: Diagnosis not present

## 2020-09-25 DIAGNOSIS — M9903 Segmental and somatic dysfunction of lumbar region: Secondary | ICD-10-CM | POA: Diagnosis not present

## 2020-09-25 DIAGNOSIS — M542 Cervicalgia: Secondary | ICD-10-CM | POA: Diagnosis not present

## 2020-09-27 DIAGNOSIS — M542 Cervicalgia: Secondary | ICD-10-CM | POA: Diagnosis not present

## 2020-09-27 DIAGNOSIS — M9902 Segmental and somatic dysfunction of thoracic region: Secondary | ICD-10-CM | POA: Diagnosis not present

## 2020-09-27 DIAGNOSIS — M9903 Segmental and somatic dysfunction of lumbar region: Secondary | ICD-10-CM | POA: Diagnosis not present

## 2020-09-27 DIAGNOSIS — M9901 Segmental and somatic dysfunction of cervical region: Secondary | ICD-10-CM | POA: Diagnosis not present

## 2020-09-30 DIAGNOSIS — M9901 Segmental and somatic dysfunction of cervical region: Secondary | ICD-10-CM | POA: Diagnosis not present

## 2020-09-30 DIAGNOSIS — M9903 Segmental and somatic dysfunction of lumbar region: Secondary | ICD-10-CM | POA: Diagnosis not present

## 2020-09-30 DIAGNOSIS — M542 Cervicalgia: Secondary | ICD-10-CM | POA: Diagnosis not present

## 2020-09-30 DIAGNOSIS — M9902 Segmental and somatic dysfunction of thoracic region: Secondary | ICD-10-CM | POA: Diagnosis not present

## 2020-10-02 DIAGNOSIS — M9903 Segmental and somatic dysfunction of lumbar region: Secondary | ICD-10-CM | POA: Diagnosis not present

## 2020-10-02 DIAGNOSIS — M9902 Segmental and somatic dysfunction of thoracic region: Secondary | ICD-10-CM | POA: Diagnosis not present

## 2020-10-02 DIAGNOSIS — M9901 Segmental and somatic dysfunction of cervical region: Secondary | ICD-10-CM | POA: Diagnosis not present

## 2020-10-02 DIAGNOSIS — M542 Cervicalgia: Secondary | ICD-10-CM | POA: Diagnosis not present

## 2020-10-04 DIAGNOSIS — M9903 Segmental and somatic dysfunction of lumbar region: Secondary | ICD-10-CM | POA: Diagnosis not present

## 2020-10-04 DIAGNOSIS — M9902 Segmental and somatic dysfunction of thoracic region: Secondary | ICD-10-CM | POA: Diagnosis not present

## 2020-10-04 DIAGNOSIS — M542 Cervicalgia: Secondary | ICD-10-CM | POA: Diagnosis not present

## 2020-10-04 DIAGNOSIS — M9901 Segmental and somatic dysfunction of cervical region: Secondary | ICD-10-CM | POA: Diagnosis not present

## 2020-10-07 DIAGNOSIS — M542 Cervicalgia: Secondary | ICD-10-CM | POA: Diagnosis not present

## 2020-10-07 DIAGNOSIS — M9902 Segmental and somatic dysfunction of thoracic region: Secondary | ICD-10-CM | POA: Diagnosis not present

## 2020-10-07 DIAGNOSIS — M9901 Segmental and somatic dysfunction of cervical region: Secondary | ICD-10-CM | POA: Diagnosis not present

## 2020-10-07 DIAGNOSIS — M9903 Segmental and somatic dysfunction of lumbar region: Secondary | ICD-10-CM | POA: Diagnosis not present

## 2020-10-09 DIAGNOSIS — M9902 Segmental and somatic dysfunction of thoracic region: Secondary | ICD-10-CM | POA: Diagnosis not present

## 2020-10-09 DIAGNOSIS — M542 Cervicalgia: Secondary | ICD-10-CM | POA: Diagnosis not present

## 2020-10-09 DIAGNOSIS — M9903 Segmental and somatic dysfunction of lumbar region: Secondary | ICD-10-CM | POA: Diagnosis not present

## 2020-10-09 DIAGNOSIS — M9901 Segmental and somatic dysfunction of cervical region: Secondary | ICD-10-CM | POA: Diagnosis not present

## 2020-10-11 DIAGNOSIS — M9901 Segmental and somatic dysfunction of cervical region: Secondary | ICD-10-CM | POA: Diagnosis not present

## 2020-10-11 DIAGNOSIS — M542 Cervicalgia: Secondary | ICD-10-CM | POA: Diagnosis not present

## 2020-10-11 DIAGNOSIS — M9902 Segmental and somatic dysfunction of thoracic region: Secondary | ICD-10-CM | POA: Diagnosis not present

## 2020-10-11 DIAGNOSIS — M9903 Segmental and somatic dysfunction of lumbar region: Secondary | ICD-10-CM | POA: Diagnosis not present

## 2020-10-14 DIAGNOSIS — M9901 Segmental and somatic dysfunction of cervical region: Secondary | ICD-10-CM | POA: Diagnosis not present

## 2020-10-14 DIAGNOSIS — M9903 Segmental and somatic dysfunction of lumbar region: Secondary | ICD-10-CM | POA: Diagnosis not present

## 2020-10-14 DIAGNOSIS — M9902 Segmental and somatic dysfunction of thoracic region: Secondary | ICD-10-CM | POA: Diagnosis not present

## 2020-10-14 DIAGNOSIS — M542 Cervicalgia: Secondary | ICD-10-CM | POA: Diagnosis not present

## 2020-10-16 DIAGNOSIS — M9901 Segmental and somatic dysfunction of cervical region: Secondary | ICD-10-CM | POA: Diagnosis not present

## 2020-10-16 DIAGNOSIS — M542 Cervicalgia: Secondary | ICD-10-CM | POA: Diagnosis not present

## 2020-10-16 DIAGNOSIS — M9902 Segmental and somatic dysfunction of thoracic region: Secondary | ICD-10-CM | POA: Diagnosis not present

## 2020-10-16 DIAGNOSIS — M9903 Segmental and somatic dysfunction of lumbar region: Secondary | ICD-10-CM | POA: Diagnosis not present

## 2020-10-21 DIAGNOSIS — M9901 Segmental and somatic dysfunction of cervical region: Secondary | ICD-10-CM | POA: Diagnosis not present

## 2020-10-21 DIAGNOSIS — M542 Cervicalgia: Secondary | ICD-10-CM | POA: Diagnosis not present

## 2020-10-21 DIAGNOSIS — M9903 Segmental and somatic dysfunction of lumbar region: Secondary | ICD-10-CM | POA: Diagnosis not present

## 2020-10-21 DIAGNOSIS — M9902 Segmental and somatic dysfunction of thoracic region: Secondary | ICD-10-CM | POA: Diagnosis not present

## 2020-10-23 DIAGNOSIS — M9902 Segmental and somatic dysfunction of thoracic region: Secondary | ICD-10-CM | POA: Diagnosis not present

## 2020-10-23 DIAGNOSIS — M9901 Segmental and somatic dysfunction of cervical region: Secondary | ICD-10-CM | POA: Diagnosis not present

## 2020-10-23 DIAGNOSIS — M542 Cervicalgia: Secondary | ICD-10-CM | POA: Diagnosis not present

## 2020-10-23 DIAGNOSIS — M9903 Segmental and somatic dysfunction of lumbar region: Secondary | ICD-10-CM | POA: Diagnosis not present

## 2020-10-31 DIAGNOSIS — M1611 Unilateral primary osteoarthritis, right hip: Secondary | ICD-10-CM | POA: Diagnosis not present

## 2020-10-31 DIAGNOSIS — M25551 Pain in right hip: Secondary | ICD-10-CM | POA: Diagnosis not present

## 2020-10-31 DIAGNOSIS — Z96642 Presence of left artificial hip joint: Secondary | ICD-10-CM | POA: Diagnosis not present

## 2020-11-26 ENCOUNTER — Telehealth: Payer: Self-pay | Admitting: Family Medicine

## 2020-11-26 NOTE — Telephone Encounter (Signed)
Please call pt.  Dr. Shaune Leeks called on behalf of patient about transferring care here.  Please verify and see about setting up a TOC appointment with me when possible.  See note re: spouse.  Thanks.

## 2020-12-03 ENCOUNTER — Telehealth: Payer: Self-pay | Admitting: Radiology

## 2020-12-03 ENCOUNTER — Other Ambulatory Visit: Payer: Self-pay

## 2020-12-03 ENCOUNTER — Encounter: Payer: Self-pay | Admitting: Family Medicine

## 2020-12-03 ENCOUNTER — Ambulatory Visit (INDEPENDENT_AMBULATORY_CARE_PROVIDER_SITE_OTHER): Payer: Medicare Other | Admitting: Family Medicine

## 2020-12-03 VITALS — BP 112/78 | HR 74 | Temp 97.4°F | Ht 65.0 in | Wt 115.0 lb

## 2020-12-03 DIAGNOSIS — E785 Hyperlipidemia, unspecified: Secondary | ICD-10-CM | POA: Diagnosis not present

## 2020-12-03 DIAGNOSIS — Z7189 Other specified counseling: Secondary | ICD-10-CM

## 2020-12-03 DIAGNOSIS — M810 Age-related osteoporosis without current pathological fracture: Secondary | ICD-10-CM

## 2020-12-03 DIAGNOSIS — Z1211 Encounter for screening for malignant neoplasm of colon: Secondary | ICD-10-CM

## 2020-12-03 LAB — CBC WITH DIFFERENTIAL/PLATELET
Basophils Absolute: 0.1 10*3/uL (ref 0.0–0.1)
Basophils Relative: 1.3 % (ref 0.0–3.0)
Eosinophils Absolute: 0 10*3/uL (ref 0.0–0.7)
Eosinophils Relative: 0.7 % (ref 0.0–5.0)
HCT: 40.7 % (ref 36.0–46.0)
Hemoglobin: 13.8 g/dL (ref 12.0–15.0)
Lymphocytes Relative: 24.2 % (ref 12.0–46.0)
Lymphs Abs: 1.4 10*3/uL (ref 0.7–4.0)
MCHC: 33.9 g/dL (ref 30.0–36.0)
MCV: 90.8 fl (ref 78.0–100.0)
Monocytes Absolute: 0.3 10*3/uL (ref 0.1–1.0)
Monocytes Relative: 6 % (ref 3.0–12.0)
Neutro Abs: 4 10*3/uL (ref 1.4–7.7)
Neutrophils Relative %: 67.8 % (ref 43.0–77.0)
Platelets: 260 10*3/uL (ref 150.0–400.0)
RBC: 4.48 Mil/uL (ref 3.87–5.11)
RDW: 13.1 % (ref 11.5–15.5)
WBC: 5.8 10*3/uL (ref 4.0–10.5)

## 2020-12-03 LAB — COMPREHENSIVE METABOLIC PANEL
ALT: 18 U/L (ref 0–35)
AST: 22 U/L (ref 0–37)
Albumin: 4.4 g/dL (ref 3.5–5.2)
Alkaline Phosphatase: 58 U/L (ref 39–117)
BUN: 16 mg/dL (ref 6–23)
CO2: 31 mEq/L (ref 19–32)
Calcium: 9.3 mg/dL (ref 8.4–10.5)
Chloride: 101 mEq/L (ref 96–112)
Creatinine, Ser: 0.63 mg/dL (ref 0.40–1.20)
GFR: 88.88 mL/min (ref >60.00)
Glucose, Bld: 92 mg/dL (ref 70–99)
Potassium: 3.9 mEq/L (ref 3.5–5.1)
Sodium: 139 mEq/L (ref 135–145)
Total Bilirubin: 0.6 mg/dL (ref 0.2–1.2)
Total Protein: 6.7 g/dL (ref 6.0–8.3)

## 2020-12-03 LAB — LIPID PANEL
Cholesterol: 216 mg/dL — ABNORMAL HIGH (ref 0–200)
HDL: 91.5 mg/dL (ref >39.00)
LDL Cholesterol: 103 mg/dL — ABNORMAL HIGH (ref 0–99)
NonHDL: 124.42
Total CHOL/HDL Ratio: 2
Triglycerides: 108 mg/dL (ref 0.0–149.0)
VLDL: 21.6 mg/dL (ref 0.0–40.0)

## 2020-12-03 LAB — VITAMIN D 25 HYDROXY (VIT D DEFICIENCY, FRACTURES): VITD: 110.88 ng/mL (ref 30.00–100.00)

## 2020-12-03 NOTE — Patient Instructions (Signed)
Go to the lab on the way out.   If you have mychart we'll likely use that to update you.    Take care.  Glad to see you. Let me know if you want to get colon cancer screening done.

## 2020-12-03 NOTE — Telephone Encounter (Signed)
[facility] called a critical Vit D - 110.88, results given to Dr Damita Dunnings

## 2020-12-03 NOTE — Progress Notes (Signed)
This visit occurred during the SARS-CoV-2 public health emergency.  Safety protocols were in place, including screening questions prior to the visit, additional usage of staff PPE, and extensive cleaning of exam room while observing appropriate contact time as indicated for disinfecting solutions.  TOC.    Osteoporosis.  Vit D replacement ongoing.  Discussed with patient about previous bone density test, wrist fracture, weightbearing exercise, and getting follow-up vitamin D level  She had covid vaccine.   H/o HLD.  Lipids pending.  Not on treatment currently.  Husband or daughter Darin Engels designated if patient were incapacitated.    D/w patient [id number] for colon cancer screening, including IFOB vs. colonoscopy.  Risks and benefits of both were discussed and patient voiced understanding.  Pt elects to consider cologuard.  If she is never ever going to go through with colonoscopy (even if she had a positive Cologuard) then would not make sense to get Cologuard done.  Rationale for colon cancer screening discussed with patient.  Mammogram 2022.    She has seen ortho about her hip.  I will defer  Meds, vitals, and allergies reviewed.   ROS: Per HPI unless specifically indicated in ROS section   GEN: nad, alert and oriented HEENT: ncat NECK: supple w/o LA CV: rrr PULM: ctab, no inc wob ABD: soft, +bs EXT: no edema SKIN: Well-perfused   The 10-year ASCVD risk score Mikey Bussing DC Jr., et al., 2013) is: 8.8%   Values used to calculate the score:     Age: 70 years     Sex: Female     Is Non-Hispanic African American: No     Diabetic: No     Tobacco smoker: No     Systolic Blood Pressure: 092 mmHg     Is BP treated: No     HDL Cholesterol: 91.5 mg/dL     Total Cholesterol: 216 mg/dL  30 minutes were devoted to patient care in this encounter (this includes time spent reviewing the patient's file/history, interviewing and examining the patient, counseling/reviewing plan with  patient).

## 2020-12-04 ENCOUNTER — Other Ambulatory Visit: Payer: Self-pay | Admitting: Family Medicine

## 2020-12-04 DIAGNOSIS — Z1211 Encounter for screening for malignant neoplasm of colon: Secondary | ICD-10-CM | POA: Insufficient documentation

## 2020-12-04 DIAGNOSIS — Z7189 Other specified counseling: Secondary | ICD-10-CM | POA: Insufficient documentation

## 2020-12-04 DIAGNOSIS — M810 Age-related osteoporosis without current pathological fracture: Secondary | ICD-10-CM

## 2020-12-04 NOTE — Assessment & Plan Note (Signed)
Vit D replacement ongoing.  Discussed with patient about previous bone density test, wrist fracture, weightbearing exercise, and getting follow-up vitamin D level

## 2020-12-04 NOTE — Assessment & Plan Note (Signed)
Husband or daughter Darin Engels designated if patient were incapacitated.

## 2020-12-04 NOTE — Assessment & Plan Note (Signed)
Not on treatment.  See follow-up notes.

## 2020-12-04 NOTE — Assessment & Plan Note (Signed)
  D/w patient Jeanne Webb for colon cancer screening, including IFOB vs. colonoscopy.  Risks and benefits of both were discussed and patient voiced understanding.  Pt elects to consider cologuard.  If she is never ever going to go through with colonoscopy (even if she had a positive Cologuard) then would not make sense to get Cologuard done.  Rationale for colon cancer screening discussed with patient.

## 2020-12-04 NOTE — Telephone Encounter (Signed)
See result note.  

## 2020-12-10 ENCOUNTER — Telehealth: Payer: Self-pay | Admitting: Family Medicine

## 2020-12-22 ENCOUNTER — Other Ambulatory Visit: Payer: Self-pay

## 2020-12-22 ENCOUNTER — Ambulatory Visit (HOSPITAL_COMMUNITY)
Admission: EM | Admit: 2020-12-22 | Discharge: 2020-12-22 | Disposition: A | Discharge status: Home / Self Care | Payer: Medicare Other | Attending: Family Medicine | Admitting: Family Medicine

## 2020-12-22 ENCOUNTER — Encounter (HOSPITAL_COMMUNITY): Payer: Self-pay

## 2020-12-22 DIAGNOSIS — U071 COVID-19: Secondary | ICD-10-CM

## 2020-12-22 MED ORDER — MOLNUPIRAVIR EUA 200MG CAPSULE: 4.0000 | ORAL_CAPSULE | Freq: Two times a day (BID) | ORAL | 0 refills | Status: AC

## 2020-12-22 NOTE — ED Triage Notes (Signed)
Pt reports she tested positive for COVID today. She states she took an at home test.   States she been having clear phlegm, fever, generalized body aches, dehydration.   Denies n/v/d.

## 2020-12-22 NOTE — ED Provider Notes (Signed)
[facility]    CSN: [id number] Arrival date & time: 12/22/20  1606      History   Chief Complaint No chief complaint on file.   HPI Jeanne Webb is a 70 y.o. female.   Patient presenting today with 1 to 2-day history of productive cough of clear mucus, mild low-grade fever, body aches, general malaise, mild sore throat.  Denies chest pain, shortness of breath, dizziness, syncope, abdominal pain, nausea vomiting or diarrhea.  So far not taking anything over-the-counter for symptoms.  No past history of pulmonary disease or immunocompromise that she is aware of.  Home COVID test positive this morning.  Past Medical History:  Diagnosis Date   Allergy    Arthritis    Cramp of limb    Plantar fascial fibromatosis    Patient Active Problem List   Diagnosis Date Noted   Advance care planning 12/04/2020   Colon cancer screening 12/04/2020   Osteoporosis 12/04/2020   S/P left THA, AA 11/09/2017   S/P hip replacement 11/09/2017   Hyperlipidemia 09/01/2012   Osteoarthritis of left hip 09/01/2012   Past Surgical History:  Procedure Laterality Date   DENTAL SURGERY     squamous cell      Removal from leg   TOTAL HIP ARTHROPLASTY Left 11/09/2017   Procedure: LEFT TOTAL HIP ARTHROPLASTY ANTERIOR APPROACH;  Surgeon: Paralee Cancel, MD;  Location: [facility];  Service: Orthopedics;  Laterality: Left;    OB History   No obstetric history on file.      Home Medications    Prior to Admission medications   Medication Sig Start Date End Date Taking? Authorizing Provider  molnupiravir EUA 200 mg CAPS Take 4 capsules (800 mg total) by mouth 2 (two) times daily for 5 days. 12/22/20 12/27/20 Yes Volney American, PA-C  Ascorbic Acid (VITAMIN C) 1000 MG tablet Take 2,000 mg by mouth daily.    [provider]  Calcium-Magnesium-Zinc (CAL-MAG-ZINC PO) Take 2 tablets by mouth 2 (two) times daily.    [provider]  Glucosamine HCl (GLUCOSAMINE PO) Take by  mouth.    [provider]  Nutritional Supplements (JUICE PLUS FIBRE PO) Take 2 tablets by mouth 2 (two) times daily.     [provider]  Probiotic Product (PROBIOTIC PO) Take 1 capsule by mouth 3 (three) times a week. 80 billion.    [provider]  TURMERIC PO Take by mouth.    [provider]  vitamin E 400 UNIT capsule Take 400 Units by mouth daily.     [provider]  Zinc 40 MG TABS Take by mouth.    [provider]    Family History Family History  Problem Relation Age of Onset   Cancer Mother        leukemia, ovarian, breast   Stroke Father    Heart disease Brother    Colon cancer Neg Hx     Social History Social History   Tobacco Use   Smoking status: Former    Years: 8.00    Types: Cigarettes   Smokeless tobacco: Never  Vaping Use   Vaping Use: Never used  Substance Use Topics   Alcohol use: No    Alcohol/week: 0.0 standard drinks   Drug use: No     Allergies   Water oral [alimentum], Lactose intolerance (gi), Nickel, Other, Seldane [terfenadine], and Penicillins   Review of Systems Review of Systems Per HPI  Physical Exam Triage Vital Signs ED  Triage Vitals  Enc Vitals Group     BP 12/22/20 1715 112/75     Pulse Rate 12/22/20 1712 70     Resp 12/22/20 1712 20     Temp 12/22/20 1712 98 F (36.7 C)     Temp Source 12/22/20 1712 Oral     SpO2 12/22/20 1712 100 %     Weight --      Height --      Head Circumference --      Peak Flow --      Pain Score 12/22/20 1710 8     Pain Loc --      Pain Edu? --      Excl. in [place]? --    No data found.  Updated Vital Signs BP 112/75 (BP Location: Right Arm)   Pulse 70   Temp 98 F (36.7 C) (Oral)   Resp 20   SpO2 100%   Visual Acuity Right Eye Distance:   Left Eye Distance:   Bilateral Distance:    Right Eye Near:   Left Eye Near:    Bilateral Near:     Physical Exam Vitals and nursing note reviewed.  Constitutional:      Appearance:  Normal appearance. She is not ill-appearing.  HENT:     Head: Atraumatic.     Right Ear: Tympanic membrane normal.     Left Ear: Tympanic membrane normal.     Nose: Rhinorrhea present.     Mouth/Throat:     Mouth: Mucous membranes are moist.     Pharynx: Oropharynx is clear. No oropharyngeal exudate.  Eyes:     Extraocular Movements: Extraocular movements intact.     Conjunctiva/sclera: Conjunctivae normal.  Cardiovascular:     Rate and Rhythm: Normal rate and regular rhythm.     Heart sounds: Normal heart sounds.  Pulmonary:     Effort: Pulmonary effort is normal.     Breath sounds: Normal breath sounds. No wheezing or rales.  Abdominal:     General: Bowel sounds are normal.     Palpations: Abdomen is soft.  Musculoskeletal:        General: Normal range of motion.     Cervical back: Normal range of motion and neck supple.  Skin:    General: Skin is warm and dry.     Findings: No rash.  Neurological:     Mental Status: She is alert and oriented to person, place, and time.     Motor: No weakness.     Gait: Gait normal.  Psychiatric:        Mood and Affect: Mood normal.        Thought Content: Thought content normal.        Judgment: Judgment normal.     UC Treatments / Results  Labs (all labs ordered are listed, but only abnormal results are displayed) Labs Reviewed - No data to display  EKG   Radiology No results found.  Procedures Procedures (including critical care time)  Medications Ordered in UC Medications - No data to display  Initial Impression / Assessment and Plan / UC Course  I have reviewed the triage vital signs and the nursing notes.  Pertinent labs & imaging results that were available during my care of the patient were reviewed by me and considered in my medical decision making (see chart for details).     Vitals and exam reassuring today, home COVID test positive.  Patient agreeable to starting antiviral treatment for COVID  in addition to  supportive home care.  Risks and side effects reviewed.  Return for acutely worsening symptoms.  Final Clinical Impressions(s) / UC Diagnoses   Final diagnoses:  [id number]   Discharge Instructions   None    ED Prescriptions     Medication Sig Dispense Auth. Provider   molnupiravir EUA 200 mg CAPS Take 4 capsules (800 mg total) by mouth 2 (two) times daily for 5 days. 40 capsule Volney American, [place]      PDMP not reviewed this encounter.   Volney American, [place] 12/22/20 1845

## 2021-01-08 DIAGNOSIS — Z20822 Contact with and (suspected) exposure to covid-19: Secondary | ICD-10-CM | POA: Diagnosis not present

## 2021-01-27 NOTE — Telephone Encounter (Signed)
error 

## 2021-02-19 DIAGNOSIS — L7211 Pilar cyst: Secondary | ICD-10-CM | POA: Diagnosis not present

## 2021-02-19 DIAGNOSIS — S80819A Abrasion, unspecified lower leg, initial encounter: Secondary | ICD-10-CM | POA: Diagnosis not present

## 2021-02-19 DIAGNOSIS — L821 Other seborrheic keratosis: Secondary | ICD-10-CM | POA: Diagnosis not present

## 2021-02-19 DIAGNOSIS — D225 Melanocytic nevi of trunk: Secondary | ICD-10-CM | POA: Diagnosis not present

## 2021-02-19 DIAGNOSIS — L578 Other skin changes due to chronic exposure to nonionizing radiation: Secondary | ICD-10-CM | POA: Diagnosis not present

## 2021-02-19 DIAGNOSIS — D2271 Melanocytic nevi of right lower limb, including hip: Secondary | ICD-10-CM | POA: Diagnosis not present

## 2021-03-04 ENCOUNTER — Other Ambulatory Visit: Payer: Medicare Other

## 2021-03-11 ENCOUNTER — Other Ambulatory Visit: Payer: Self-pay

## 2021-03-11 ENCOUNTER — Other Ambulatory Visit (INDEPENDENT_AMBULATORY_CARE_PROVIDER_SITE_OTHER): Payer: Medicare Other

## 2021-03-11 DIAGNOSIS — M810 Age-related osteoporosis without current pathological fracture: Secondary | ICD-10-CM | POA: Diagnosis not present

## 2021-03-11 LAB — VITAMIN D 25 HYDROXY (VIT D DEFICIENCY, FRACTURES): VITD: 58.59 ng/mL (ref 30.00–100.00)

## 2021-04-03 ENCOUNTER — Ambulatory Visit: Payer: Medicare Other | Admitting: Family Medicine

## 2021-04-15 ENCOUNTER — Encounter: Payer: Self-pay | Admitting: Family Medicine

## 2021-04-15 ENCOUNTER — Other Ambulatory Visit: Payer: Self-pay

## 2021-04-15 ENCOUNTER — Ambulatory Visit (INDEPENDENT_AMBULATORY_CARE_PROVIDER_SITE_OTHER): Payer: Medicare Other | Admitting: Family Medicine

## 2021-04-15 VITALS — BP 120/78 | HR 77 | Temp 97.1°F | Ht 65.0 in | Wt 117.0 lb

## 2021-04-15 DIAGNOSIS — M810 Age-related osteoporosis without current pathological fracture: Secondary | ICD-10-CM | POA: Diagnosis not present

## 2021-04-15 MED ORDER — VITAMIN D3 25 MCG (1000 UT) PO CAPS: 1000.0000 [IU] | ORAL_CAPSULE | Freq: Two times a day (BID) | ORAL | Status: AC

## 2021-04-15 NOTE — Patient Instructions (Addendum)
Goals: don't smoke- weight bearing exercise- vitamin D.   Fosmax or similar would be the first line agent.  Please consider that in the meantime.    Take care.  Glad to see you.  Recheck vit D in about 3 months.

## 2021-04-15 NOTE — Progress Notes (Signed)
This visit occurred during the SARS-CoV-2 public health emergency.  Safety protocols were in place, including screening questions prior to the visit, additional usage of staff PPE, and extensive cleaning of exam room while observing appropriate contact time as indicated for disinfecting solutions.  D/w pt about DXA results and osteoporosis path/phys in general, including vit D and calcium. She already has worsening T score noted, compared to prev, d/w pt.   Reasonable to consider treatment with bisphosphonate, ie fosamax.  D/w pt about risk benefit, especially GI sx, jaw and long bone pathology.   She'll consider and update me as needed.    Meds, vitals, and allergies reviewed.   ROS: Per HPI unless specifically indicated in ROS section   Nad Ncat Rrr Ctab Ext well perfused.   35 minutes spent in face to face time with patient, >50% spent in counselling or coordination of care.

## 2021-04-16 NOTE — Assessment & Plan Note (Signed)
D/w pt about DXA results and osteoporosis path/phys in general, including vit D and calcium. She already has worsening T score noted, compared to prev, d/w pt.   Reasonable to consider treatment with bisphosphonate, ie fosamax.  D/w pt about risk benefit, especially GI sx, jaw and long bone pathology.   She'll consider and update me as needed.    Restart vitamin D.  She previously had elevated level.  Most recent level is normal.  She noticed hair loss on vitamin D supplement the meantime.  Start 1000 units twice a day and recheck vitamin D level in about 3 months.

## 2021-05-29 ENCOUNTER — Encounter: Payer: Medicare Other | Admitting: Family

## 2021-06-19 DIAGNOSIS — H4911 Fourth [trochlear] nerve palsy, right eye: Secondary | ICD-10-CM | POA: Diagnosis not present

## 2021-07-03 DIAGNOSIS — Z1231 Encounter for screening mammogram for malignant neoplasm of breast: Secondary | ICD-10-CM | POA: Diagnosis not present

## 2021-07-03 LAB — HM MAMMOGRAPHY

## 2021-08-06 DIAGNOSIS — Z20822 Contact with and (suspected) exposure to covid-19: Secondary | ICD-10-CM | POA: Diagnosis not present

## 2021-08-29 DIAGNOSIS — Z20822 Contact with and (suspected) exposure to covid-19: Secondary | ICD-10-CM | POA: Diagnosis not present

## 2021-09-08 DIAGNOSIS — M9901 Segmental and somatic dysfunction of cervical region: Secondary | ICD-10-CM | POA: Diagnosis not present

## 2021-09-08 DIAGNOSIS — M9903 Segmental and somatic dysfunction of lumbar region: Secondary | ICD-10-CM | POA: Diagnosis not present

## 2021-09-08 DIAGNOSIS — M5136 Other intervertebral disc degeneration, lumbar region: Secondary | ICD-10-CM | POA: Diagnosis not present

## 2021-09-08 DIAGNOSIS — M50322 Other cervical disc degeneration at C5-C6 level: Secondary | ICD-10-CM | POA: Diagnosis not present

## 2021-09-09 DIAGNOSIS — M5136 Other intervertebral disc degeneration, lumbar region: Secondary | ICD-10-CM | POA: Diagnosis not present

## 2021-09-09 DIAGNOSIS — M9901 Segmental and somatic dysfunction of cervical region: Secondary | ICD-10-CM | POA: Diagnosis not present

## 2021-09-09 DIAGNOSIS — M9903 Segmental and somatic dysfunction of lumbar region: Secondary | ICD-10-CM | POA: Diagnosis not present

## 2021-09-09 DIAGNOSIS — M50322 Other cervical disc degeneration at C5-C6 level: Secondary | ICD-10-CM | POA: Diagnosis not present

## 2021-09-12 DIAGNOSIS — M9903 Segmental and somatic dysfunction of lumbar region: Secondary | ICD-10-CM | POA: Diagnosis not present

## 2021-09-12 DIAGNOSIS — M9901 Segmental and somatic dysfunction of cervical region: Secondary | ICD-10-CM | POA: Diagnosis not present

## 2021-09-12 DIAGNOSIS — M5136 Other intervertebral disc degeneration, lumbar region: Secondary | ICD-10-CM | POA: Diagnosis not present

## 2021-09-12 DIAGNOSIS — M50322 Other cervical disc degeneration at C5-C6 level: Secondary | ICD-10-CM | POA: Diagnosis not present

## 2021-09-17 DIAGNOSIS — M9903 Segmental and somatic dysfunction of lumbar region: Secondary | ICD-10-CM | POA: Diagnosis not present

## 2021-09-17 DIAGNOSIS — M9901 Segmental and somatic dysfunction of cervical region: Secondary | ICD-10-CM | POA: Diagnosis not present

## 2021-09-17 DIAGNOSIS — M50322 Other cervical disc degeneration at C5-C6 level: Secondary | ICD-10-CM | POA: Diagnosis not present

## 2021-09-17 DIAGNOSIS — M5136 Other intervertebral disc degeneration, lumbar region: Secondary | ICD-10-CM | POA: Diagnosis not present

## 2021-09-22 DIAGNOSIS — Z20828 Contact with and (suspected) exposure to other viral communicable diseases: Secondary | ICD-10-CM | POA: Diagnosis not present

## 2021-09-22 DIAGNOSIS — M9901 Segmental and somatic dysfunction of cervical region: Secondary | ICD-10-CM | POA: Diagnosis not present

## 2021-09-22 DIAGNOSIS — M9903 Segmental and somatic dysfunction of lumbar region: Secondary | ICD-10-CM | POA: Diagnosis not present

## 2021-09-22 DIAGNOSIS — M50322 Other cervical disc degeneration at C5-C6 level: Secondary | ICD-10-CM | POA: Diagnosis not present

## 2021-09-22 DIAGNOSIS — M5136 Other intervertebral disc degeneration, lumbar region: Secondary | ICD-10-CM | POA: Diagnosis not present

## 2021-09-24 DIAGNOSIS — Z20822 Contact with and (suspected) exposure to covid-19: Secondary | ICD-10-CM | POA: Diagnosis not present

## 2021-09-26 DIAGNOSIS — M9903 Segmental and somatic dysfunction of lumbar region: Secondary | ICD-10-CM | POA: Diagnosis not present

## 2021-09-26 DIAGNOSIS — M50322 Other cervical disc degeneration at C5-C6 level: Secondary | ICD-10-CM | POA: Diagnosis not present

## 2021-09-26 DIAGNOSIS — M5136 Other intervertebral disc degeneration, lumbar region: Secondary | ICD-10-CM | POA: Diagnosis not present

## 2021-09-26 DIAGNOSIS — M9901 Segmental and somatic dysfunction of cervical region: Secondary | ICD-10-CM | POA: Diagnosis not present

## 2021-09-30 DIAGNOSIS — M5136 Other intervertebral disc degeneration, lumbar region: Secondary | ICD-10-CM | POA: Diagnosis not present

## 2021-09-30 DIAGNOSIS — M9901 Segmental and somatic dysfunction of cervical region: Secondary | ICD-10-CM | POA: Diagnosis not present

## 2021-09-30 DIAGNOSIS — M9903 Segmental and somatic dysfunction of lumbar region: Secondary | ICD-10-CM | POA: Diagnosis not present

## 2021-09-30 DIAGNOSIS — M50322 Other cervical disc degeneration at C5-C6 level: Secondary | ICD-10-CM | POA: Diagnosis not present

## 2021-10-03 DIAGNOSIS — M5136 Other intervertebral disc degeneration, lumbar region: Secondary | ICD-10-CM | POA: Diagnosis not present

## 2021-10-03 DIAGNOSIS — M9901 Segmental and somatic dysfunction of cervical region: Secondary | ICD-10-CM | POA: Diagnosis not present

## 2021-10-03 DIAGNOSIS — M9903 Segmental and somatic dysfunction of lumbar region: Secondary | ICD-10-CM | POA: Diagnosis not present

## 2021-10-03 DIAGNOSIS — M50322 Other cervical disc degeneration at C5-C6 level: Secondary | ICD-10-CM | POA: Diagnosis not present

## 2021-10-06 DIAGNOSIS — Z20822 Contact with and (suspected) exposure to covid-19: Secondary | ICD-10-CM | POA: Diagnosis not present

## 2021-10-07 DIAGNOSIS — Z20822 Contact with and (suspected) exposure to covid-19: Secondary | ICD-10-CM | POA: Diagnosis not present

## 2021-10-21 ENCOUNTER — Telehealth: Payer: Self-pay | Admitting: Family Medicine

## 2021-10-21 NOTE — Telephone Encounter (Signed)
Left message for patient to call back and schedule Medicare Annual Wellness Visit (AWV) to be completed by video or phone. ? ? ? ?Last AWV: 05/27/2020 ? ? ? ?Please schedule at anytime with  ?[facility]    ? ? ? ?45 minute appointment ? ? ? ?Any questions, please contact me at [phone] ?

## 2021-10-22 ENCOUNTER — Telehealth: Payer: Self-pay | Admitting: Family Medicine

## 2021-10-22 NOTE — Telephone Encounter (Signed)
Pt called and stated she needs to reschedule her Medicare Health Nurse Appointment on 10/28/2021 '@8'$ :15 am. Please advise ? ?Callback Number: [phone] ?

## 2021-11-21 ENCOUNTER — Telehealth: Payer: Self-pay | Admitting: Family Medicine

## 2021-11-21 NOTE — Telephone Encounter (Signed)
LM with patients husband to have pt rtn my call to schedule AWV with [facility]. Please schedule this appt if pt calls the office.

## 2021-12-03 ENCOUNTER — Ambulatory Visit (INDEPENDENT_AMBULATORY_CARE_PROVIDER_SITE_OTHER): Payer: Medicare Other

## 2021-12-03 VITALS — Ht 65.0 in | Wt 115.0 lb

## 2021-12-03 DIAGNOSIS — Z0000 Encounter for general adult medical examination without abnormal findings: Secondary | ICD-10-CM

## 2021-12-03 NOTE — Progress Notes (Signed)
I connected with Shomari Matusik today by telephone and verified that I am speaking with the correct person using two identifiers. Location patient: home Location provider: work Persons participating in the virtual visit: [name], [name] LPN.   I discussed the limitations, risks, security and privacy concerns of performing an evaluation and management service by telephone and the availability of in person appointments. I also discussed with the patient that there may be a patient responsible charge related to this service. The patient expressed understanding and verbally consented to this telephonic visit.    Interactive audio and video telecommunications were attempted between this provider and patient, however failed, due to patient having technical difficulties OR patient did not have access to video capability.  We continued and completed visit with audio only.     Vital signs may be patient reported or missing.  Subjective:   Jeanne Webb is a 71 y.o. female who presents for Medicare Annual (Subsequent) preventive examination.  Review of Systems     Cardiac Risk Factors include: advanced age (>24mn, >>71women);dyslipidemia     Objective:    Today's Vitals   12/03/21 0811  Weight: 115 lb (52.2 kg)  Height: '5\' 5"'$  (1.651 m)   Body mass index is 19.14 kg/m.     12/03/2021    8:21 AM 08/05/2020    2:16 PM 05/27/2020    8:53 AM 06/01/2019    8:19 AM 11/09/2017    3:30 PM 11/02/2017    8:58 AM 02/15/2017   10:07 AM  Advanced Directives  Does Patient Have a Medical Advance Directive? Yes Yes Yes No Yes Yes Yes  Type of AParamedicof ADuggerLiving will HBensvilleLiving will Living will;Healthcare Power of AWeemsOut of facility DNR (pink MOST or yellow form)  Living will Living will HM[place]Living will  Does patient want to make changes to medical advance directive?  No - Patient declined No -  Patient declined  No - Patient declined No - Patient declined   Copy of [place] Chart? No - copy requested No - copy requested No - copy requested    No - copy requested  Would patient like information on creating a medical advance directive?    Yes (Inpatient - patient defers creating a medical advance directive and declines information at this time)       Current Medications (verified) Outpatient Encounter Medications as of 12/03/2021  Medication Sig   Ascorbic Acid (VITAMIN C) 1000 MG tablet Take 2,000 mg by mouth daily.   Calcium-Magnesium-Zinc (CAL-MAG-ZINC PO) Take 2 tablets by mouth 2 (two) times daily.   Cholecalciferol (VITAMIN D3) 25 MCG (1000 UT) CAPS Take 1 capsule (1,000 Units total) by mouth in the morning and at bedtime.   Glucosamine HCl (GLUCOSAMINE PO) Take by mouth.   Nutritional Supplements (JUICE PLUS FIBRE PO) Take 2 tablets by mouth 2 (two) times daily.    Omega-3 Fatty Acids (FISH OIL) 1000 MG CAPS Take 1 capsule by mouth 2 (two) times daily.   TURMERIC PO Take by mouth.   vitamin E 400 UNIT capsule Take 400 Units by mouth daily.    Probiotic Product (PROBIOTIC PO) Take 1 capsule by mouth 3 (three) times a week. 80 billion. (Patient not taking: Reported on 12/03/2021)   Zinc 40 MG TABS Take by mouth. (Patient not taking: Reported on 12/03/2021)   No facility-administered encounter medications on file as of 12/03/2021.    Allergies (  verified) Water oral [alimentum], Lactose intolerance (gi), Nickel, Other, Seldane [terfenadine], and Penicillins   History: Past Medical History:  Diagnosis Date   Allergy    Arthritis    Cramp of limb    Osteoporosis    Plantar fascial fibromatosis    Past Surgical History:  Procedure Laterality Date   DENTAL SURGERY     squamous cell      Removal from leg   TOTAL HIP ARTHROPLASTY Left 11/09/2017   Procedure: LEFT TOTAL HIP ARTHROPLASTY ANTERIOR APPROACH;  Surgeon: Paralee Cancel, MD;  Location: [facility];   Service: Orthopedics;  Laterality: Left;   Family History  Problem Relation Age of Onset   Cancer Mother        leukemia, ovarian, breast   Stroke Father    Heart disease Brother    Colon cancer Neg Hx    Social History   Socioeconomic History   Marital status: Married    Spouse name: Not on file   Number of children: Not on file   Years of education: Not on file   Highest education level: Not on file  Occupational History   Not on file  Tobacco Use   Smoking status: Former    Years: 8.00    Types: Cigarettes   Smokeless tobacco: Never  Vaping Use   Vaping Use: Never used  Substance and Sexual Activity   Alcohol use: No    Alcohol/week: 0.0 standard drinks of alcohol   Drug use: No   Sexual activity: Not Currently  Other Topics Concern   Not on file  Social History Narrative   From [place], lived in [place].   Artist (painting, realism), owns rental properties.     Married 1974   6 grankids   Social Determinants of Radio broadcast assistant Strain: Low Risk  (12/03/2021)   Overall Financial Resource Strain (CARDIA)    Difficulty of Paying Living Expenses: Not hard at all  Food Insecurity: No Food Insecurity (12/03/2021)   Hunger Vital Sign    Worried About Running Out of Food in the Last Year: Never true    Ran Out of Food in the Last Year: Never true  Transportation Needs: No Transportation Needs (12/03/2021)   PRAPARE - Hydrologist (Medical): No    Lack of Transportation (Non-Medical): No  Physical Activity: Inactive (12/03/2021)   Exercise Vital Sign    Days of Exercise per Week: 0 days    Minutes of Exercise per Session: 0 min  Stress: No Stress Concern Present (12/03/2021)   [place]    Feeling of Stress : Not at all  Social Connections: Not on file    Tobacco Counseling Counseling given: Not Answered   Clinical Intake:  Pre-visit preparation  completed: Yes  Pain : No/denies pain     Nutritional Status: BMI of 19-24  Normal Nutritional Risks: None Diabetes: No  How often do you need to have someone help you when you read instructions, pamphlets, or other written materials from your doctor or pharmacy?: 1 - Never What is the last grade level you completed in school?: college  Diabetic? no  Interpreter Needed?: No  Information entered by :: NAllen LPN   Activities of Daily Living    12/03/2021    8:24 AM  In your present state of health, do you have any difficulty performing the following activities:  Hearing? 0  Vision? 1  Comment not  as sharp, some blurriness in left eye  Difficulty concentrating or making decisions? 1  Walking or climbing stairs? 0  Dressing or bathing? 0  Doing errands, shopping? 0  Preparing Food and eating ? N  Using the Toilet? N  In the past six months, have you accidently leaked urine? Y  Do you have problems with loss of bowel control? N  Managing your Medications? Y  Managing your Finances? N  Housekeeping or managing your Housekeeping? N    Patient Care Team: Tonia Ghent, MD as PCP - General (Family Medicine) Gayland Curry, DO (Geriatric Medicine) Jari Pigg, MD as Consulting Physician (Dermatology)  Indicate any recent Medical Services you may have received from other than [facility] providers in the past year (date may be approximate).     Assessment:   This is a routine wellness examination for Amauri.  Hearing/Vision screen Vision Screening - Comments:: Regular eye exams, Dr. Linton Flemings, [place]  Dietary issues and exercise activities discussed: Current Exercise Habits: The patient does not participate in regular exercise at present   Goals Addressed             This Visit's Progress    Patient Stated       12/03/2021, start exercising (swimming)       Depression Screen    12/03/2021    8:24 AM 05/27/2020    8:45 AM 06/01/2019    8:15 AM  05/24/2019    9:22 AM 10/15/2017   10:45 AM 02/10/2017    8:55 AM 01/24/2016    9:10 AM  PHQ 2/9 Scores  PHQ - 2 Score 0 0 0 0 0 0 0    Fall Risk    12/03/2021    8:22 AM 05/27/2020    8:44 AM 06/01/2019    8:15 AM 05/24/2019    9:21 AM 01/06/2019   10:54 AM  Fall Risk   Falls in the past year? '1 1 1 '$ 0 0  Comment walking too fast Tripped while walking fast with Dog. States that her face was bruised up.   Emmi Telephone Survey: data to providers prior to load  Number falls in past yr: 0 0 0 0   Injury with Fall? 0 1 0 0   Risk for fall due to : No Fall Risks      Follow up Falls evaluation completed;Education provided;Falls prevention discussed        FALL RISK PREVENTION PERTAINING TO THE HOME:  Any stairs in or around the home? Yes  If so, are there any without handrails? Yes  Home free of loose throw rugs in walkways, pet beds, electrical cords, etc? Yes  Adequate lighting in your home to reduce risk of falls? Yes   ASSISTIVE DEVICES UTILIZED TO PREVENT FALLS:  Life alert? No  Use of a cane, walker or w/c? No  Grab bars in the bathroom? No  Shower chair or bench in shower? No  Elevated toilet seat or a handicapped toilet? Yes   TIMED UP AND GO:  Was the test performed? No .      Cognitive Function:    02/10/2017    9:37 AM 01/24/2016    9:39 AM 01/11/2015    9:34 AM  MMSE - Mini Mental State Exam  Orientation to time '5 5 5  '$ Orientation to Place '5 5 5  '$ Registration '3 3 3  '$ Attention/ Calculation '5 5 5  '$ Recall '3 3 3  '$ Language- name 2 objects 2 2  2  Language- repeat '1 1 1  '$ Language- follow 3 step command '3 3 3  '$ Language- read & follow direction '1 1 1  '$ Write a sentence '1 1 1  '$ Copy design '1 1 1  '$ Total score '30 30 30        '$ 12/03/2021    8:26 AM 05/27/2020    8:46 AM 05/24/2019    9:31 AM  6CIT Screen  What Year? 0 points 0 points 0 points  What month? 0 points 0 points 0 points  What time? 0 points 0 points 0 points  Count back from 20 0 points 0 points 0  points  Months in reverse 0 points 0 points 0 points  Repeat phrase 0 points 0 points 0 points  Total Score 0 points 0 points 0 points    Immunizations Immunization History  Administered Date(s) Administered   PFIZER(Purple Top)SARS-COV-2 Vaccination 01/12/2020, 01/31/2020    TDAP status: Due, Education has been provided regarding the importance of this vaccine. Advised may receive this vaccine at local pharmacy or Health Dept. Aware to provide a copy of the vaccination record if obtained from local pharmacy or Health Dept. Verbalized acceptance and understanding.  Flu Vaccine status: Declined, Education has been provided regarding the importance of this vaccine but patient still declined. Advised may receive this vaccine at local pharmacy or Health Dept. Aware to provide a copy of the vaccination record if obtained from local pharmacy or Health Dept. Verbalized acceptance and understanding.  Pneumococcal vaccine status: Declined,  Education has been provided regarding the importance of this vaccine but patient still declined. Advised may receive this vaccine at local pharmacy or Health Dept. Aware to provide a copy of the vaccination record if obtained from local pharmacy or Health Dept. Verbalized acceptance and understanding.   Covid-19 vaccine status: Completed vaccines  Qualifies for Shingles Vaccine? Yes   Zostavax completed No   Shingrix Completed?: No.    Education has been provided regarding the importance of this vaccine. Patient has been advised to call insurance company to determine out of pocket expense if they have not yet received this vaccine. Advised may also receive vaccine at local pharmacy or Health Dept. Verbalized acceptance and understanding.  Screening Tests Health Maintenance  Topic Date Due   COVID-19 Vaccine (3 - Pfizer risk series) 12/19/2021 (Originally 02/28/2020)   Zoster Vaccines- Shingrix (1 of 2) 03/05/2022 (Originally 11/29/1969)   Pneumonia Vaccine 52+  Years old (1 - PCV) 12/04/2022 (Originally 11/28/2013)   COLONOSCOPY (Pts 45-27yr Insurance coverage will need to be confirmed)  12/04/2022 (Originally 11/28/1993)   MAMMOGRAM  07/04/2023   DEXA SCAN  Completed   Hepatitis C Screening  Completed   HPV VACCINES  Aged Out    Health Maintenance  There are no preventive care reminders to display for this patient.   Colorectal cancer screening: decline   Mammogram status: Completed 07/03/2021. Repeat every year  Bone Density status: Completed 06/27/2020.   Lung Cancer Screening: (Low Dose CT Chest recommended if Age 556-80years, 30 pack-year currently smoking OR have quit w/in 15years.) does not qualify.   Lung Cancer Screening Referral: no  Additional Screening:  Hepatitis C Screening: does qualify; Completed 02/10/2017  Vision Screening: Recommended annual ophthalmology exams for early detection of glaucoma and other disorders of the eye. Is the patient up to date with their annual eye exam?  Yes  Who is the provider or what is the name of the office in which the patient attends annual eye  exams? [facility] If pt is not established with a provider, would they like to be referred to a provider to establish care? No .   Dental Screening: Recommended annual dental exams for proper oral hygiene  Community Resource Referral / Chronic Care Management: CRR required this visit?  No   CCM required this visit?  No      Plan:     I have personally reviewed and noted the following in the patient's chart:   Medical and social history Use of alcohol, tobacco or illicit drugs  Current medications and supplements including opioid prescriptions.  Functional ability and status Nutritional status Physical activity Advanced directives List of other physicians Hospitalizations, surgeries, and ER visits in previous 12 months Vitals Screenings to include cognitive, depression, and falls Referrals and appointments  In addition, I  have reviewed and discussed with patient certain preventive protocols, quality metrics, and best practice recommendations. A written personalized care plan for preventive services as well as general preventive health recommendations were provided to patient.     Kellie Simmering, LPN   [id number]   Nurse Notes: none  Due to this being a virtual visit, the after visit summary with patients personalized plan was offered to patient via mail or my-chart. Patient would like to access on my-chart

## 2021-12-03 NOTE — Patient Instructions (Signed)
Jeanne Webb , Thank you for taking time to come for your Medicare Wellness Visit. I appreciate your ongoing commitment to your health goals. Please review the following plan we discussed and let me know if I can assist you in the future.   Screening recommendations/referrals: Colonoscopy: decline Mammogram: completed 07/03/2021 Bone Density: completed 06/27/2020 Recommended yearly ophthalmology/optometry visit for glaucoma screening and checkup Recommended yearly dental visit for hygiene and checkup  Vaccinations: Influenza vaccine: n/a Pneumococcal vaccine: n/a Tdap vaccine: n/a Shingles vaccine: n/a   Covid-19: 01/31/2020, 01/12/2020  Advanced directives: Please bring a copy of your POA (Power of Attorney) and/or Living Will to your next appointment.   Conditions/risks identified: none  Next appointment: Follow up in one year for your annual wellness visit    Preventive Care 65 Years and Older, Female Preventive care refers to lifestyle choices and visits with your health care provider that can promote health and wellness. What does preventive care include? A yearly physical exam. This is also called an annual well check. Dental exams once or twice a year. Routine eye exams. Ask your health care provider how often you should have your eyes checked. Personal lifestyle choices, including: Daily care of your teeth and gums. Regular physical activity. Eating a healthy diet. Avoiding tobacco and drug use. Limiting alcohol use. Practicing safe sex. Taking low-dose aspirin every day. Taking vitamin and mineral supplements as recommended by your health care provider. What happens during an annual well check? The services and screenings done by your health care provider during your annual well check will depend on your age, overall health, lifestyle risk factors, and family history of disease. Counseling  Your health care provider may ask you questions about your: Alcohol use. Tobacco  use. Drug use. Emotional well-being. Home and relationship well-being. Sexual activity. Eating habits. History of falls. Memory and ability to understand (cognition). Work and work Statistician. Reproductive health. Screening  You may have the following tests or measurements: Height, weight, and BMI. Blood pressure. Lipid and cholesterol levels. These may be checked every 5 years, or more frequently if you are over 66 years old. Skin check. Lung cancer screening. You may have this screening every year starting at age 83 if you have a 30-pack-year history of smoking and currently smoke or have quit within the past 15 years. Fecal occult blood test (FOBT) of the stool. You may have this test every year starting at age 50. Flexible sigmoidoscopy or colonoscopy. You may have a sigmoidoscopy every 5 years or a colonoscopy every 10 years starting at age 39. Hepatitis C blood test. Hepatitis B blood test. Sexually transmitted disease (STD) testing. Diabetes screening. This is done by checking your blood sugar (glucose) after you have not eaten for a while (fasting). You may have this done every 1-3 years. Bone density scan. This is done to screen for osteoporosis. You may have this done starting at age 7. Mammogram. This may be done every 1-2 years. Talk to your health care provider about how often you should have regular mammograms. Talk with your health care provider about your test results, treatment options, and if necessary, the need for more tests. Vaccines  Your health care provider may recommend certain vaccines, such as: Influenza vaccine. This is recommended every year. Tetanus, diphtheria, and acellular pertussis (Tdap, Td) vaccine. You may need a Td booster every 10 years. Zoster vaccine. You may need this after age 55. Pneumococcal 13-valent conjugate (PCV13) vaccine. One dose is recommended after age 38. Pneumococcal polysaccharide (  PPSV23) vaccine. One dose is recommended  after age 32. Talk to your health care provider about which screenings and vaccines you need and how often you need them. This information is not intended to replace advice given to you by your health care provider. Make sure you discuss any questions you have with your health care provider. Document Released: 06/21/2015 Document Revised: 02/12/2016 Document Reviewed: 03/26/2015 Elsevier Interactive Patient Education  2017 [place] Prevention in the Home Falls can cause injuries. They can happen to people of all ages. There are many things you can do to make your home safe and to help prevent falls. What can I do on the outside of my home? Regularly fix the edges of walkways and driveways and fix any cracks. Remove anything that might make you trip as you walk through a door, such as a raised step or threshold. Trim any bushes or trees on the path to your home. Use bright outdoor lighting. Clear any walking paths of anything that might make someone trip, such as rocks or tools. Regularly check to see if handrails are loose or broken. Make sure that both sides of any steps have handrails. Any raised decks and porches should have guardrails on the edges. Have any leaves, snow, or ice cleared regularly. Use sand or salt on walking paths during winter. Clean up any spills in your garage right away. This includes oil or grease spills. What can I do in the bathroom? Use night lights. Install grab bars by the toilet and in the tub and shower. Do not use towel bars as grab bars. Use non-skid mats or decals in the tub or shower. If you need to sit down in the shower, use a plastic, non-slip stool. Keep the floor dry. Clean up any water that spills on the floor as soon as it happens. Remove soap buildup in the tub or shower regularly. Attach bath mats securely with double-sided non-slip rug tape. Do not have throw rugs and other things on the floor that can make you trip. What can I do  in the bedroom? Use night lights. Make sure that you have a light by your bed that is easy to reach. Do not use any sheets or blankets that are too big for your bed. They should not hang down onto the floor. Have a firm chair that has side arms. You can use this for support while you get dressed. Do not have throw rugs and other things on the floor that can make you trip. What can I do in the kitchen? Clean up any spills right away. Avoid walking on wet floors. Keep items that you use a lot in easy-to-reach places. If you need to reach something above you, use a strong step stool that has a grab bar. Keep electrical cords out of the way. Do not use floor polish or wax that makes floors slippery. If you must use wax, use non-skid floor wax. Do not have throw rugs and other things on the floor that can make you trip. What can I do with my stairs? Do not leave any items on the stairs. Make sure that there are handrails on both sides of the stairs and use them. Fix handrails that are broken or loose. Make sure that handrails are as long as the stairways. Check any carpeting to make sure that it is firmly attached to the stairs. Fix any carpet that is loose or worn. Avoid having throw rugs at the top or  bottom of the stairs. If you do have throw rugs, attach them to the floor with carpet tape. Make sure that you have a light switch at the top of the stairs and the bottom of the stairs. If you do not have them, ask someone to add them for you. What else can I do to help prevent falls? Wear shoes that: Do not have high heels. Have rubber bottoms. Are comfortable and fit you well. Are closed at the toe. Do not wear sandals. If you use a stepladder: Make sure that it is fully opened. Do not climb a closed stepladder. Make sure that both sides of the stepladder are locked into place. Ask someone to hold it for you, if possible. Clearly mark and make sure that you can see: Any grab bars or  handrails. First and last steps. Where the edge of each step is. Use tools that help you move around (mobility aids) if they are needed. These include: Canes. Walkers. Scooters. Crutches. Turn on the lights when you go into a dark area. Replace any light bulbs as soon as they burn out. Set up your furniture so you have a clear path. Avoid moving your furniture around. If any of your floors are uneven, fix them. If there are any pets around you, be aware of where they are. Review your medicines with your doctor. Some medicines can make you feel dizzy. This can increase your chance of falling. Ask your doctor what other things that you can do to help prevent falls. This information is not intended to replace advice given to you by your health care provider. Make sure you discuss any questions you have with your health care provider. Document Released: 03/21/2009 Document Revised: 10/31/2015 Document Reviewed: 06/29/2014 Elsevier Interactive Patient Education  2017 [employer].

## 2022-02-16 DIAGNOSIS — M50322 Other cervical disc degeneration at C5-C6 level: Secondary | ICD-10-CM | POA: Diagnosis not present

## 2022-02-16 DIAGNOSIS — M9903 Segmental and somatic dysfunction of lumbar region: Secondary | ICD-10-CM | POA: Diagnosis not present

## 2022-02-16 DIAGNOSIS — M9901 Segmental and somatic dysfunction of cervical region: Secondary | ICD-10-CM | POA: Diagnosis not present

## 2022-02-16 DIAGNOSIS — M5136 Other intervertebral disc degeneration, lumbar region: Secondary | ICD-10-CM | POA: Diagnosis not present

## 2022-02-17 DIAGNOSIS — M9901 Segmental and somatic dysfunction of cervical region: Secondary | ICD-10-CM | POA: Diagnosis not present

## 2022-02-17 DIAGNOSIS — M5136 Other intervertebral disc degeneration, lumbar region: Secondary | ICD-10-CM | POA: Diagnosis not present

## 2022-02-17 DIAGNOSIS — M50322 Other cervical disc degeneration at C5-C6 level: Secondary | ICD-10-CM | POA: Diagnosis not present

## 2022-02-17 DIAGNOSIS — M9903 Segmental and somatic dysfunction of lumbar region: Secondary | ICD-10-CM | POA: Diagnosis not present

## 2022-02-18 DIAGNOSIS — M25511 Pain in right shoulder: Secondary | ICD-10-CM | POA: Diagnosis not present

## 2022-02-20 DIAGNOSIS — M5136 Other intervertebral disc degeneration, lumbar region: Secondary | ICD-10-CM | POA: Diagnosis not present

## 2022-02-20 DIAGNOSIS — M50322 Other cervical disc degeneration at C5-C6 level: Secondary | ICD-10-CM | POA: Diagnosis not present

## 2022-02-20 DIAGNOSIS — M9903 Segmental and somatic dysfunction of lumbar region: Secondary | ICD-10-CM | POA: Diagnosis not present

## 2022-02-20 DIAGNOSIS — M9901 Segmental and somatic dysfunction of cervical region: Secondary | ICD-10-CM | POA: Diagnosis not present

## 2022-02-23 DIAGNOSIS — M5136 Other intervertebral disc degeneration, lumbar region: Secondary | ICD-10-CM | POA: Diagnosis not present

## 2022-02-23 DIAGNOSIS — M9903 Segmental and somatic dysfunction of lumbar region: Secondary | ICD-10-CM | POA: Diagnosis not present

## 2022-02-23 DIAGNOSIS — M50322 Other cervical disc degeneration at C5-C6 level: Secondary | ICD-10-CM | POA: Diagnosis not present

## 2022-02-23 DIAGNOSIS — M9901 Segmental and somatic dysfunction of cervical region: Secondary | ICD-10-CM | POA: Diagnosis not present

## 2022-02-25 DIAGNOSIS — M5136 Other intervertebral disc degeneration, lumbar region: Secondary | ICD-10-CM | POA: Diagnosis not present

## 2022-02-25 DIAGNOSIS — M9903 Segmental and somatic dysfunction of lumbar region: Secondary | ICD-10-CM | POA: Diagnosis not present

## 2022-02-25 DIAGNOSIS — M50322 Other cervical disc degeneration at C5-C6 level: Secondary | ICD-10-CM | POA: Diagnosis not present

## 2022-02-25 DIAGNOSIS — M9901 Segmental and somatic dysfunction of cervical region: Secondary | ICD-10-CM | POA: Diagnosis not present

## 2022-02-26 DIAGNOSIS — M25511 Pain in right shoulder: Secondary | ICD-10-CM | POA: Diagnosis not present

## 2022-02-26 DIAGNOSIS — S46001A Unspecified injury of muscle(s) and tendon(s) of the rotator cuff of right shoulder, initial encounter: Secondary | ICD-10-CM | POA: Diagnosis not present

## 2022-02-27 DIAGNOSIS — M9901 Segmental and somatic dysfunction of cervical region: Secondary | ICD-10-CM | POA: Diagnosis not present

## 2022-02-27 DIAGNOSIS — M5136 Other intervertebral disc degeneration, lumbar region: Secondary | ICD-10-CM | POA: Diagnosis not present

## 2022-02-27 DIAGNOSIS — M9903 Segmental and somatic dysfunction of lumbar region: Secondary | ICD-10-CM | POA: Diagnosis not present

## 2022-02-27 DIAGNOSIS — M50322 Other cervical disc degeneration at C5-C6 level: Secondary | ICD-10-CM | POA: Diagnosis not present

## 2022-03-02 DIAGNOSIS — M9901 Segmental and somatic dysfunction of cervical region: Secondary | ICD-10-CM | POA: Diagnosis not present

## 2022-03-02 DIAGNOSIS — M50322 Other cervical disc degeneration at C5-C6 level: Secondary | ICD-10-CM | POA: Diagnosis not present

## 2022-03-02 DIAGNOSIS — M5136 Other intervertebral disc degeneration, lumbar region: Secondary | ICD-10-CM | POA: Diagnosis not present

## 2022-03-02 DIAGNOSIS — M9903 Segmental and somatic dysfunction of lumbar region: Secondary | ICD-10-CM | POA: Diagnosis not present

## 2022-03-04 DIAGNOSIS — M9901 Segmental and somatic dysfunction of cervical region: Secondary | ICD-10-CM | POA: Diagnosis not present

## 2022-03-04 DIAGNOSIS — M5136 Other intervertebral disc degeneration, lumbar region: Secondary | ICD-10-CM | POA: Diagnosis not present

## 2022-03-04 DIAGNOSIS — M50322 Other cervical disc degeneration at C5-C6 level: Secondary | ICD-10-CM | POA: Diagnosis not present

## 2022-03-04 DIAGNOSIS — M9903 Segmental and somatic dysfunction of lumbar region: Secondary | ICD-10-CM | POA: Diagnosis not present

## 2022-03-06 DIAGNOSIS — M5136 Other intervertebral disc degeneration, lumbar region: Secondary | ICD-10-CM | POA: Diagnosis not present

## 2022-03-06 DIAGNOSIS — M9901 Segmental and somatic dysfunction of cervical region: Secondary | ICD-10-CM | POA: Diagnosis not present

## 2022-03-06 DIAGNOSIS — M50322 Other cervical disc degeneration at C5-C6 level: Secondary | ICD-10-CM | POA: Diagnosis not present

## 2022-03-06 DIAGNOSIS — M9903 Segmental and somatic dysfunction of lumbar region: Secondary | ICD-10-CM | POA: Diagnosis not present

## 2022-03-13 DIAGNOSIS — M25511 Pain in right shoulder: Secondary | ICD-10-CM | POA: Diagnosis not present

## 2022-03-27 DIAGNOSIS — L7211 Pilar cyst: Secondary | ICD-10-CM | POA: Diagnosis not present

## 2022-03-30 DIAGNOSIS — M25511 Pain in right shoulder: Secondary | ICD-10-CM | POA: Diagnosis not present

## 2022-04-03 DIAGNOSIS — M25511 Pain in right shoulder: Secondary | ICD-10-CM | POA: Diagnosis not present

## 2022-04-10 DIAGNOSIS — M25511 Pain in right shoulder: Secondary | ICD-10-CM | POA: Diagnosis not present

## 2022-04-13 DIAGNOSIS — M25511 Pain in right shoulder: Secondary | ICD-10-CM | POA: Diagnosis not present

## 2022-04-16 DIAGNOSIS — M25511 Pain in right shoulder: Secondary | ICD-10-CM | POA: Diagnosis not present

## 2022-04-22 DIAGNOSIS — M25511 Pain in right shoulder: Secondary | ICD-10-CM | POA: Diagnosis not present

## 2022-04-24 DIAGNOSIS — M25511 Pain in right shoulder: Secondary | ICD-10-CM | POA: Diagnosis not present

## 2022-04-28 DIAGNOSIS — M25511 Pain in right shoulder: Secondary | ICD-10-CM | POA: Diagnosis not present

## 2022-05-08 DIAGNOSIS — M25511 Pain in right shoulder: Secondary | ICD-10-CM | POA: Diagnosis not present

## 2022-05-11 DIAGNOSIS — M50322 Other cervical disc degeneration at C5-C6 level: Secondary | ICD-10-CM | POA: Diagnosis not present

## 2022-05-11 DIAGNOSIS — M9901 Segmental and somatic dysfunction of cervical region: Secondary | ICD-10-CM | POA: Diagnosis not present

## 2022-05-11 DIAGNOSIS — M5136 Other intervertebral disc degeneration, lumbar region: Secondary | ICD-10-CM | POA: Diagnosis not present

## 2022-05-11 DIAGNOSIS — M9903 Segmental and somatic dysfunction of lumbar region: Secondary | ICD-10-CM | POA: Diagnosis not present

## 2022-05-13 DIAGNOSIS — M50322 Other cervical disc degeneration at C5-C6 level: Secondary | ICD-10-CM | POA: Diagnosis not present

## 2022-05-13 DIAGNOSIS — M9903 Segmental and somatic dysfunction of lumbar region: Secondary | ICD-10-CM | POA: Diagnosis not present

## 2022-05-13 DIAGNOSIS — M9901 Segmental and somatic dysfunction of cervical region: Secondary | ICD-10-CM | POA: Diagnosis not present

## 2022-05-13 DIAGNOSIS — M5136 Other intervertebral disc degeneration, lumbar region: Secondary | ICD-10-CM | POA: Diagnosis not present

## 2022-05-15 DIAGNOSIS — M25511 Pain in right shoulder: Secondary | ICD-10-CM | POA: Diagnosis not present

## 2022-05-20 DIAGNOSIS — M50322 Other cervical disc degeneration at C5-C6 level: Secondary | ICD-10-CM | POA: Diagnosis not present

## 2022-05-20 DIAGNOSIS — M9903 Segmental and somatic dysfunction of lumbar region: Secondary | ICD-10-CM | POA: Diagnosis not present

## 2022-05-20 DIAGNOSIS — M9901 Segmental and somatic dysfunction of cervical region: Secondary | ICD-10-CM | POA: Diagnosis not present

## 2022-05-20 DIAGNOSIS — M5136 Other intervertebral disc degeneration, lumbar region: Secondary | ICD-10-CM | POA: Diagnosis not present

## 2022-05-22 DIAGNOSIS — M25511 Pain in right shoulder: Secondary | ICD-10-CM | POA: Diagnosis not present

## 2022-05-27 DIAGNOSIS — M9903 Segmental and somatic dysfunction of lumbar region: Secondary | ICD-10-CM | POA: Diagnosis not present

## 2022-05-27 DIAGNOSIS — M5136 Other intervertebral disc degeneration, lumbar region: Secondary | ICD-10-CM | POA: Diagnosis not present

## 2022-05-27 DIAGNOSIS — M9901 Segmental and somatic dysfunction of cervical region: Secondary | ICD-10-CM | POA: Diagnosis not present

## 2022-05-27 DIAGNOSIS — M50322 Other cervical disc degeneration at C5-C6 level: Secondary | ICD-10-CM | POA: Diagnosis not present

## 2022-05-29 DIAGNOSIS — M25511 Pain in right shoulder: Secondary | ICD-10-CM | POA: Diagnosis not present

## 2022-06-10 DIAGNOSIS — M5136 Other intervertebral disc degeneration, lumbar region: Secondary | ICD-10-CM | POA: Diagnosis not present

## 2022-06-10 DIAGNOSIS — M50322 Other cervical disc degeneration at C5-C6 level: Secondary | ICD-10-CM | POA: Diagnosis not present

## 2022-06-10 DIAGNOSIS — M9903 Segmental and somatic dysfunction of lumbar region: Secondary | ICD-10-CM | POA: Diagnosis not present

## 2022-06-10 DIAGNOSIS — M9901 Segmental and somatic dysfunction of cervical region: Secondary | ICD-10-CM | POA: Diagnosis not present

## 2022-06-17 DIAGNOSIS — M5136 Other intervertebral disc degeneration, lumbar region: Secondary | ICD-10-CM | POA: Diagnosis not present

## 2022-06-17 DIAGNOSIS — M50322 Other cervical disc degeneration at C5-C6 level: Secondary | ICD-10-CM | POA: Diagnosis not present

## 2022-06-17 DIAGNOSIS — M9901 Segmental and somatic dysfunction of cervical region: Secondary | ICD-10-CM | POA: Diagnosis not present

## 2022-06-17 DIAGNOSIS — M9903 Segmental and somatic dysfunction of lumbar region: Secondary | ICD-10-CM | POA: Diagnosis not present

## 2022-06-19 DIAGNOSIS — M25511 Pain in right shoulder: Secondary | ICD-10-CM | POA: Diagnosis not present

## 2022-06-26 DIAGNOSIS — M25511 Pain in right shoulder: Secondary | ICD-10-CM | POA: Diagnosis not present

## 2022-07-02 DIAGNOSIS — M25511 Pain in right shoulder: Secondary | ICD-10-CM | POA: Diagnosis not present

## 2022-07-03 DIAGNOSIS — H2512 Age-related nuclear cataract, left eye: Secondary | ICD-10-CM | POA: Diagnosis not present

## 2022-07-03 DIAGNOSIS — H25042 Posterior subcapsular polar age-related cataract, left eye: Secondary | ICD-10-CM | POA: Diagnosis not present

## 2022-07-03 DIAGNOSIS — H2511 Age-related nuclear cataract, right eye: Secondary | ICD-10-CM | POA: Diagnosis not present

## 2022-07-20 DIAGNOSIS — M25511 Pain in right shoulder: Secondary | ICD-10-CM | POA: Diagnosis not present

## 2022-07-29 DIAGNOSIS — H25042 Posterior subcapsular polar age-related cataract, left eye: Secondary | ICD-10-CM | POA: Diagnosis not present

## 2022-07-29 DIAGNOSIS — H2512 Age-related nuclear cataract, left eye: Secondary | ICD-10-CM | POA: Diagnosis not present

## 2022-07-30 DIAGNOSIS — M25511 Pain in right shoulder: Secondary | ICD-10-CM | POA: Diagnosis not present

## 2022-08-04 ENCOUNTER — Encounter: Payer: Self-pay | Admitting: Anesthesiology

## 2022-08-04 ENCOUNTER — Encounter: Payer: Self-pay | Admitting: Ophthalmology

## 2022-08-06 NOTE — Discharge Instructions (Signed)

## 2022-08-07 ENCOUNTER — Telehealth: Payer: Self-pay

## 2022-08-07 NOTE — Telephone Encounter (Signed)
[place] RECORD [facility] Patient Name: [facility] Gender: Female DOB: 18-Mar-1951 Age: 72 Y 18 M 6 D Return Phone Number: [phone] (Primary) Address: City/ State/ Zip: [place]  [address] Client [place] [facility] - Client Client Site [place] Provider Elsie Stain "Brigitte Pulse MD Contact Type Call Who Is Calling Patient / Member / Family / Caregiver Call Type Triage / Clinical Relationship To Patient Self Return Phone Number [phone] (Primary) Chief Complaint Health information question (non symptomatic) Reason for Call Symptomatic / Request for [place] states her husband has COVID. Caller states she would like to be tested. Translation No Nurse Assessment Nurse: Hassell Done, RN, Joelene Millin Date/Time Eilene Ghazi Time): 08/06/2022 5:11:55 PM Confirm and document reason for call. If symptomatic, describe symptoms. ---caller states husband tested positive for covid today. she is wondering if she should get tested. only symptom is a slight scratchy throat. otherwise no symptoms at all. no fever. Does the patient have any new or worsening symptoms? ---No Guidelines Guideline Title Affirmed Question Affirmed Notes Nurse Date/Time (Eastern Time) COVID-19 - Exposure [1] [id number] EXPOSURE within last 14 days AND [2] NO symptoms Alanda Amass 08/06/2022 5:13:07 PM Disp. Time Eilene Ghazi Time) Disposition Final User 08/06/2022 5:24:19 PM Home Care Yes Hassell Done RN, Joelene Millin Final Disposition 08/06/2022 5:24:19 PM Home Care Yes Hassell Done, RN, Renea Ee Disagree/Comply Comply Caller Understands Yes PreDisposition Call Doctor Care Advice Given Per Guideline PLEASE NOTE: All timestamps contained within this report are represented as [place] Standard Time. CONFIDENTIALTY NOTICE: This fax transmission is intended only for  the addressee. It contains information that is legally privileged, confidential or otherwise protected from use or disclosure. If you are not the intended recipient, you are strictly prohibited from reviewing, disclosing, copying using or disseminating any of this information or taking any action in reliance on or regarding this information. If you have received this fax in error, please notify us immediately by telephone so that we can arrange for its return to [place]. Phone: [phone], Toll-Free: [phone], Fax: [phone] Page: 2 of 2 Call Id: [id number] Care Advice Given Per Guideline HOME CARE: * You should be able to treat this at home. * NO QUARANTINE: You do not need to stay home unless you develop symptoms. However, you should take these precautions: * ... WEAR A MASK: Wear a well-fitted mask for 10 full days any time you are around others inside your home or in public. Do not go to places where you are unable to wear a mask. * ... GET TESTED: Get tested at least 5 days after you last had close contact with someone with COVID-19. When counting days, remember that day 0 is the day you were last exposed. Day 1 is the next full day after the day you were exposed. * ... WATCH FOR SYMPTOMS: Watch for symptoms of COVID-19 until 14 days after you last had close contact with someone with COVID-19. REASSURANCE AND EDUCATION - COVID-19 EXPOSURE AND NO SYMPTOMS AND ... YOU HAD A POSITIVE TEST IN PAST 30 DAYS: * NO QUARANTINE: You do not need to stay home unless you develop symptoms. However, you should take these precautions: * ... WEAR A MASK: Wear a well-fitted mask for 10 full days any time you are around others inside your home or in public. Do not go to places where you are unable to wear a mask. * Your doctor (or NP/PA) can  order a COVID-19 test for you. * Many clinics, retail clinics (such as [facility], [facility]), and urgent care centers perform testing. * Testing is also available at some local  and state public health departments. * SELF-TESTS (such as [employer] BinaxNOW) for use at home are available in some drugstores (such as [facility], [facility]). You can also buy them on the internet (such as [employer], [place], [employer]). In the [place] free self-tests are available at COVIDtests.GOV. Negative self-tests need to be repeated. * Fever or feeling feverish occurs within 14 days of COVID-19 exposure * Cough or difficulty breathing occur within 14 days of COVID-19 exposure * Loss of taste or smell occurs * Other symptoms you think might be from COVID-19 occur * You have more questions CALL BACK IF: CARE ADVICE given per COVID-19 - Exposure (Adult) guideline

## 2022-08-07 NOTE — Telephone Encounter (Signed)
Noted. Thanks.

## 2022-08-07 NOTE — Telephone Encounter (Signed)
I spoke with pt and due to her exposure time she is going to test on 08/10/22 and if condition changes or worsens over weekend pt will test sooner and UC & ED precautions given to pt and pt voiced understanding. Pt said she would cb on 08/10/22 with update. Sending note to Dr Damita Dunnings and Damita Dunnings pool.

## 2022-08-10 DIAGNOSIS — U071 COVID-19: Secondary | ICD-10-CM

## 2022-08-10 HISTORY — DX: COVID-19: U07.1

## 2022-08-10 NOTE — Telephone Encounter (Signed)
Patient called back today with update. She tested with poc test from pharmacy and was positive today. She is currently not having any symptoms. Denies any fever cough, congestion, nausea  vomiting or headache. She states she does have come congestion in her head but that is all.

## 2022-08-10 NOTE — Telephone Encounter (Signed)
Patient notified to rest and drink plenty of fluids. Advised to call back if worsens.

## 2022-08-10 NOTE — Telephone Encounter (Signed)
Please update patient.  If only minimal symptoms, then reasonable to observe for now.  Rest and fluids, supportive care.  If worsening symptoms then reasonable to treat.  Please update [place] if any worse.  Thanks.

## 2022-08-11 ENCOUNTER — Ambulatory Visit: Admission: RE | Admit: 2022-08-11 | Payer: Medicare Other | Source: Ambulatory Visit | Admitting: Ophthalmology

## 2022-08-11 HISTORY — DX: Unspecified rotator cuff tear or rupture of unspecified shoulder, not specified as traumatic: M75.100

## 2022-08-11 SURGERY — PHACOEMULSIFICATION, CATARACT, WITH IOL INSERTION
Anesthesia: Topical | Laterality: Left

## 2022-08-25 ENCOUNTER — Encounter: Payer: Self-pay | Admitting: Anesthesiology

## 2022-08-25 ENCOUNTER — Encounter: Payer: Self-pay | Admitting: Ophthalmology

## 2022-08-27 DIAGNOSIS — M25511 Pain in right shoulder: Secondary | ICD-10-CM | POA: Diagnosis not present

## 2022-08-27 NOTE — Discharge Instructions (Signed)

## 2022-09-01 ENCOUNTER — Ambulatory Visit
Admission: RE | Admit: 2022-09-01 | Discharge: 2022-09-01 | Disposition: A | Discharge status: Home / Self Care | Payer: Medicare Other | Attending: Ophthalmology | Admitting: Ophthalmology

## 2022-09-01 ENCOUNTER — Encounter: Payer: Self-pay | Admitting: Ophthalmology

## 2022-09-01 ENCOUNTER — Encounter
Admission: RE | Disposition: A | Discharge status: Home / Self Care | Payer: Self-pay | Source: Home / Self Care | Attending: Ophthalmology

## 2022-09-01 ENCOUNTER — Other Ambulatory Visit: Payer: Self-pay

## 2022-09-01 DIAGNOSIS — H2512 Age-related nuclear cataract, left eye: Secondary | ICD-10-CM | POA: Insufficient documentation

## 2022-09-01 HISTORY — PX: CATARACT EXTRACTION W/PHACO: SHX586

## 2022-09-01 SURGERY — PHACOEMULSIFICATION, CATARACT, WITH IOL INSERTION
Anesthesia: Topical | Site: Eye | Laterality: Left

## 2022-09-01 MED ORDER — SIGHTPATH DOSE#1 NA CHONDROIT SULF-NA HYALURON 40-17 MG/ML IO SOLN
INTRAOCULAR | Status: DC | PRN
  Administered 2022-09-01: 1 mL via INTRAOCULAR

## 2022-09-01 MED ORDER — SIGHTPATH DOSE#1 BSS IO SOLN
INTRAOCULAR | Status: DC | PRN
  Administered 2022-09-01: 54 mL via OPHTHALMIC

## 2022-09-01 MED ORDER — SIGHTPATH DOSE#1 BSS IO SOLN
INTRAOCULAR | Status: DC | PRN
  Administered 2022-09-01: 15 mL

## 2022-09-01 MED ORDER — SIGHTPATH DOSE#1 BSS IO SOLN
INTRAOCULAR | Status: DC | PRN
  Administered 2022-09-01: 1 mL via INTRAMUSCULAR

## 2022-09-01 MED ORDER — BRIMONIDINE TARTRATE-TIMOLOL 0.2-0.5 % OP SOLN
OPHTHALMIC | Status: DC | PRN
  Administered 2022-09-01: 1 [drp] via OPHTHALMIC

## 2022-09-01 MED ORDER — MOXIFLOXACIN HCL 0.5 % OP SOLN
OPHTHALMIC | Status: DC | PRN
  Administered 2022-09-01: .2 mL via OPHTHALMIC

## 2022-09-01 MED ORDER — ARMC OPHTHALMIC DILATING DROPS
1.0000 | OPHTHALMIC | Status: DC | PRN
  Administered 2022-09-01 (×3): 1 via OPHTHALMIC

## 2022-09-01 MED ORDER — TETRACAINE HCL 0.5 % OP SOLN
1.0000 [drp] | OPHTHALMIC | Status: DC | PRN
  Administered 2022-09-01 (×3): 1 [drp] via OPHTHALMIC

## 2022-09-01 SURGICAL SUPPLY — 10 items
CATARACT SUITE SIGHTPATH (MISCELLANEOUS) ×1 IMPLANT
FEE CATARACT SUITE SIGHTPATH (MISCELLANEOUS) ×1 IMPLANT
GLOVE BIOGEL PI IND STRL 8 (GLOVE) ×1 IMPLANT
GLOVE SURG ENC TEXT LTX SZ8 (GLOVE) ×1 IMPLANT
LENS IOL TECNIS EYHANCE 21.5 (Intraocular Lens) IMPLANT
NDL FILTER BLUNT 18X1 1/2 (NEEDLE) ×1 IMPLANT
NEEDLE FILTER BLUNT 18X1 1/2 (NEEDLE) ×1 IMPLANT
SYR 3ML LL SCALE MARK (SYRINGE) ×1 IMPLANT
WATER STERILE IRR 250ML POUR (IV SOLUTION) ×1 IMPLANT
WICK EYE OCUCEL (MISCELLANEOUS) IMPLANT

## 2022-09-01 NOTE — OR Nursing (Signed)
Vital Signs (No Anesthesia) 0829 P = 65 O2 Sat = 100% BP = 109/65  0835 P = 64 O2 Sat = 100% BP = 104/66  0840 P = 61 O2 Sat = 100% BP = 99/62  0845 P = 62 O2 Sat = 100% BP = 110/66

## 2022-09-01 NOTE — Op Note (Signed)
PREOPERATIVE DIAGNOSIS:  Nuclear sclerotic cataract of the left eye.   POSTOPERATIVE DIAGNOSIS:  Nuclear sclerotic cataract of the left eye.   OPERATIVE PROCEDURE:ORPROCALL@   SURGEON:  Birder Robson, MD.   ANESTHESIA:  No anesthesia staff entered.  1.      Managed anesthesia care. 2.     0.34ml of Shugarcaine was instilled following the paracentesis   COMPLICATIONS:  None.   TECHNIQUE:   Stop and chop   DESCRIPTION OF PROCEDURE:  The patient was examined and consented in the preoperative holding area where the aforementioned topical anesthesia was applied to the left eye and then brought back to the Operating Room where the left eye was prepped and draped in the usual sterile ophthalmic fashion and a lid speculum was placed. A paracentesis was created with the side port blade and the anterior chamber was filled with viscoelastic. A near clear corneal incision was performed with the steel keratome. A continuous curvilinear capsulorrhexis was performed with a cystotome followed by the capsulorrhexis forceps. Hydrodissection and hydrodelineation were carried out with BSS on a blunt cannula. The lens was removed in a stop and chop  technique and the remaining cortical material was removed with the irrigation-aspiration handpiece. The capsular bag was inflated with viscoelastic and the Technis [id number] lens was placed in the capsular bag without complication. The remaining viscoelastic was removed from the eye with the irrigation-aspiration handpiece. The wounds were hydrated. The anterior chamber was flushed with BSS and the eye was inflated to physiologic pressure. 0.85ml Vigamox was placed in the anterior chamber. The wounds were found to be water tight. The eye was dressed with Combigan. The patient was given protective glasses to wear throughout the day and a shield with which to sleep tonight. The patient was also given drops with which to begin a drop regimen today and will follow-up with me in  one day. Implant Name Type Inv. Item Serial No. Manufacturer Lot No. LRB No. Used Action  LENS IOL TECNIS EYHANCE 21.5 - [id number] Intraocular Lens LENS IOL TECNIS EYHANCE 21.5 [id number] SIGHTPATH  Left 1 Implanted    Procedure(s): CATARACT EXTRACTION PHACO AND INTRAOCULAR LENS PLACEMENT (IOC) LEFT  6.78  00:44.3 (Left)  Electronically signed: Birder Robson 09/01/2022 8:47 AM

## 2022-09-01 NOTE — H&P (Signed)
[facility]   Primary Care Physician:  Tonia Ghent, MD Ophthalmologist: Dr. Hortense Ramal  Pre-Procedure History & Physical: HPI:  Jeanne Webb is a 72 y.o. female here for cataract surgery.   Past Medical History:  Diagnosis Date   Allergy    Arthritis    COVID-19 08/10/2022   Symptoms resolved 08/17/22   Cramp of limb    Osteoporosis    Plantar fascial fibromatosis    Rotator cuff tear    right    Past Surgical History:  Procedure Laterality Date   DENTAL SURGERY     squamous cell      Removal from leg   TOTAL HIP ARTHROPLASTY Left 11/09/2017   Procedure: LEFT TOTAL HIP ARTHROPLASTY ANTERIOR APPROACH;  Surgeon: Paralee Cancel, MD;  Location: [facility];  Service: Orthopedics;  Laterality: Left;    Prior to Admission medications   Medication Sig Start Date End Date Taking? Authorizing Provider  Ascorbic Acid (VITAMIN C) 1000 MG tablet Take 2,000 mg by mouth daily.   Yes [provider]  Calcium-Magnesium-Zinc (CAL-MAG-ZINC PO) Take 2 tablets by mouth 2 (two) times daily.   Yes [provider]  Cholecalciferol (VITAMIN D3) 25 MCG (1000 UT) CAPS Take 1 capsule (1,000 Units total) by mouth in the morning and at bedtime. 04/15/21  Yes Tonia Ghent, MD  Cyanocobalamin (RA VITAMIN B-12 PO) Take by mouth daily.   Yes [provider]  Glucosamine HCl (GLUCOSAMINE PO) Take by mouth.   Yes [provider]  VITAMIN A PO Take by mouth daily.   Yes [provider]  vitamin E 400 UNIT capsule Take 400 Units by mouth daily.    Yes [provider]  Zinc 40 MG TABS Take by mouth.   Yes [provider]    Allergies as of 08/25/2022 - Review Complete 08/25/2022  Allergen Reaction Noted   Water oral [alimentum] Anaphylaxis 07/08/2012   Lactose intolerance (gi)  09/01/2012   Nickel  11/02/2017   Other  10/22/2017   Seldane [terfenadine]  07/08/2012   Penicillins Rash 07/08/2012    Family History  Problem Relation  Age of Onset   Cancer Mother        leukemia, ovarian, breast   Stroke Father    Heart disease Brother    Colon cancer Neg Hx     Social History   Socioeconomic History   Marital status: Married    Spouse name: Not on file   Number of children: Not on file   Years of education: Not on file   Highest education level: Not on file  Occupational History   Not on file  Tobacco Use   Smoking status: Former    Years: 8    Types: Cigarettes    Quit date: 1974    Years since quitting: 50.2   Smokeless tobacco: Never  Vaping Use   Vaping Use: Never used  Substance and Sexual Activity   Alcohol use: No    Alcohol/week: 0.0 standard drinks of alcohol   Drug use: No   Sexual activity: Not Currently  Other Topics Concern   Not on file  Social History Narrative   From [place], lived in [place].   Artist (painting, realism), owns rental properties.     Married 1974   6 grankids   Social Determinants of Radio broadcast assistant Strain: Low Risk  (12/03/2021)   Overall Financial Resource Strain (CARDIA)    Difficulty of Paying Living Expenses:  Not hard at all  Food Insecurity: No Food Insecurity (12/03/2021)   Hunger Vital Sign    Worried About Running Out of Food in the Last Year: Never true    Ran Out of Food in the Last Year: Never true  Transportation Needs: No Transportation Needs (12/03/2021)   PRAPARE - Hydrologist (Medical): No    Lack of Transportation (Non-Medical): No  Physical Activity: Inactive (12/03/2021)   Exercise Vital Sign    Days of Exercise per Week: 0 days    Minutes of Exercise per Session: 0 min  Stress: No Stress Concern Present (12/03/2021)   [place]    Feeling of Stress : Not at all  Social Connections: Not on file  Intimate Partner Violence: Not on file    Review of Systems: See HPI, otherwise negative ROS  Physical Exam: BP 107/65   Pulse  68   Temp (!) 97.4 F (36.3 C) (Temporal)   Resp 18   Ht 5\' 5"  (1.651 m)   Wt 50.8 kg   SpO2 100%   BMI 18.64 kg/m  General:   Alert, cooperative in NAD Head:  Normocephalic and atraumatic. Respiratory:  Normal work of breathing. Cardiovascular:  RRR  Impression/Plan: Jeanne Webb is here for cataract surgery.  Risks, benefits, limitations, and alternatives regarding cataract surgery have been reviewed with the patient.  Questions have been answered.  All parties agreeable.   Birder Robson, MD  09/01/2022, 8:14 AM

## 2022-09-02 ENCOUNTER — Encounter: Payer: Self-pay | Admitting: Ophthalmology

## 2022-09-24 DIAGNOSIS — M25511 Pain in right shoulder: Secondary | ICD-10-CM | POA: Diagnosis not present

## 2022-10-05 ENCOUNTER — Encounter: Payer: Self-pay | Admitting: Family Medicine

## 2022-10-05 ENCOUNTER — Ambulatory Visit (INDEPENDENT_AMBULATORY_CARE_PROVIDER_SITE_OTHER): Payer: Medicare Other | Admitting: Family Medicine

## 2022-10-05 VITALS — BP 120/82 | HR 97 | Temp 98.0°F | Ht 65.0 in | Wt 115.0 lb

## 2022-10-05 DIAGNOSIS — H6990 Unspecified Eustachian tube disorder, unspecified ear: Secondary | ICD-10-CM | POA: Diagnosis not present

## 2022-10-05 DIAGNOSIS — L989 Disorder of the skin and subcutaneous tissue, unspecified: Secondary | ICD-10-CM | POA: Diagnosis not present

## 2022-10-05 DIAGNOSIS — H8110 Benign paroxysmal vertigo, unspecified ear: Secondary | ICD-10-CM

## 2022-10-05 NOTE — Progress Notes (Unsigned)
Dizzy sensation for about 1 month. Noted with leaning over. She feels likes she is spinning, not the room.  No syncope.  Had an initial episode in bed with sensation of motion, that was the worse episode.  Quick onset.  Intermittent. Variable sx duration.    Scalp lesion. Present for years.  Small cystic lesion, open comedone.    Meds, vitals, and allergies reviewed.   ROS: Per HPI unless specifically indicated in ROS section   Nad Ncat Scalp with small cystic lesion, open comedone.  No spreading erythema. Neck supple, no LA TM w/o erythema bilaterally but eustachian tube dysfunction bilaterally. Rrr Ctab DHP positive laying back and looking to R.  Clearly reproduces her symptoms.

## 2022-10-05 NOTE — Patient Instructions (Addendum)
It looks like an open comedone (ie blackhead) on your scalp.  I would try go gently express it after a shower.    ETD.  Gently try to pop your ears and use nasal saline.   BPV.  Use the bed side exercise and update [place] as needed.   Take care.  Glad to see you.

## 2022-10-07 ENCOUNTER — Telehealth: Payer: Self-pay | Admitting: Family Medicine

## 2022-10-07 DIAGNOSIS — L989 Disorder of the skin and subcutaneous tissue, unspecified: Secondary | ICD-10-CM | POA: Insufficient documentation

## 2022-10-07 DIAGNOSIS — H8110 Benign paroxysmal vertigo, unspecified ear: Secondary | ICD-10-CM | POA: Insufficient documentation

## 2022-10-07 DIAGNOSIS — H6990 Unspecified Eustachian tube disorder, unspecified ear: Secondary | ICD-10-CM | POA: Insufficient documentation

## 2022-10-07 NOTE — Telephone Encounter (Signed)
Pt called in requesting a call back stated PCP wanted a update about her scalp issues  . Please advise [phone]

## 2022-10-07 NOTE — Assessment & Plan Note (Signed)
Anatomy discussed with patient.  Use bedside exercise and update [place] as needed.

## 2022-10-07 NOTE — Assessment & Plan Note (Signed)
It looks like an open comedone (ie blackhead) on the scalp.  I would try go gently express it after a shower/warm compress.

## 2022-10-07 NOTE — Assessment & Plan Note (Signed)
Separate from vertigo.  Gently perform Valsalva and use nasal saline.

## 2022-10-08 NOTE — Telephone Encounter (Signed)
LMTCB

## 2022-10-08 NOTE — Telephone Encounter (Signed)
Patient called in returning a call

## 2022-10-08 NOTE — Telephone Encounter (Signed)
I put in the referral.  Thanks.  

## 2022-10-08 NOTE — Telephone Encounter (Signed)
Left message to return call to our office.  

## 2022-10-08 NOTE — Addendum Note (Signed)
Addended by: Joaquim Nam on: 10/08/2022 04:47 PM   Modules accepted: Orders

## 2022-10-08 NOTE — Telephone Encounter (Signed)
Spoke with patient and scalp is no better. Patient is requesting a referral to a dermatologist

## 2022-10-09 NOTE — Telephone Encounter (Signed)
Patient has been advised referral was done

## 2022-11-25 ENCOUNTER — Telehealth: Payer: Self-pay | Admitting: Family Medicine

## 2022-11-25 NOTE — Telephone Encounter (Signed)
Would keep it clean with soap and water, can use a warm compress if needed for discomfort.  If spreading erythema or draining sig amount of pus, then needs eval in the meantime.  Thanks.

## 2022-11-25 NOTE — Telephone Encounter (Signed)
Pt came by office stating on 10/05/22 during an ov, her & Dr. Para March discussed a black head in her scalp. Pt stated Para March instructed some ways she could try pulling the blackhead off while being at home. Pt stated last night, 6/18, while in the shower, she was able to pull the blackhead out but she is still experiencing a little pain in the area. Pt stated the dermatologist, Para March referred her to, wouldn't be able to see her until 9/16 & she plans to keep the appt. Pt asked if there was something she can to keep the area from possibly getting infected? Call back # [phone]

## 2022-11-25 NOTE — Telephone Encounter (Signed)
Called patient and reviewed all information. Patient verbalized understanding. Will call if any further questions.  

## 2023-01-06 ENCOUNTER — Encounter (INDEPENDENT_AMBULATORY_CARE_PROVIDER_SITE_OTHER): Payer: Self-pay

## 2023-02-11 ENCOUNTER — Encounter: Payer: Self-pay | Admitting: Family Medicine

## 2023-02-11 ENCOUNTER — Ambulatory Visit (INDEPENDENT_AMBULATORY_CARE_PROVIDER_SITE_OTHER): Payer: Medicare Other | Admitting: Family Medicine

## 2023-02-11 VITALS — BP 120/80 | HR 82 | Temp 97.7°F | Ht 65.0 in | Wt 110.0 lb

## 2023-02-11 DIAGNOSIS — M791 Myalgia, unspecified site: Secondary | ICD-10-CM

## 2023-02-11 LAB — COMPREHENSIVE METABOLIC PANEL
ALT: 24 U/L (ref 0–35)
AST: 25 U/L (ref 0–37)
Albumin: 4.3 g/dL (ref 3.5–5.2)
Alkaline Phosphatase: 60 U/L (ref 39–117)
BUN: 11 mg/dL (ref 6–23)
CO2: 32 meq/L (ref 19–32)
Calcium: 9.5 mg/dL (ref 8.4–10.5)
Chloride: 101 meq/L (ref 96–112)
Creatinine, Ser: 0.7 mg/dL (ref 0.40–1.20)
GFR: 85.33 mL/min (ref >60.00)
Glucose, Bld: 68 mg/dL — ABNORMAL LOW (ref 70–99)
Potassium: 4 meq/L (ref 3.5–5.1)
Sodium: 140 meq/L (ref 135–145)
Total Bilirubin: 0.7 mg/dL (ref 0.2–1.2)
Total Protein: 6.8 g/dL (ref 6.0–8.3)

## 2023-02-11 LAB — CBC WITH DIFFERENTIAL/PLATELET
Basophils Absolute: 0.1 10*3/uL (ref 0.0–0.1)
Basophils Relative: 2.3 % (ref 0.0–3.0)
Eosinophils Absolute: 0.1 10*3/uL (ref 0.0–0.7)
Eosinophils Relative: 1.8 % (ref 0.0–5.0)
HCT: 44 % (ref 36.0–46.0)
Hemoglobin: 14.6 g/dL (ref 12.0–15.0)
Lymphocytes Relative: 34.3 % (ref 12.0–46.0)
Lymphs Abs: 1.4 10*3/uL (ref 0.7–4.0)
MCHC: 33.2 g/dL (ref 30.0–36.0)
MCV: 94.2 fl (ref 78.0–100.0)
Monocytes Absolute: 0.3 10*3/uL (ref 0.1–1.0)
Monocytes Relative: 6.9 % (ref 3.0–12.0)
Neutro Abs: 2.3 10*3/uL (ref 1.4–7.7)
Neutrophils Relative %: 54.7 % (ref 43.0–77.0)
Platelets: 247 10*3/uL (ref 150.0–400.0)
RBC: 4.67 Mil/uL (ref 3.87–5.11)
RDW: 13.2 % (ref 11.5–15.5)
WBC: 4.2 10*3/uL (ref 4.0–10.5)

## 2023-02-11 LAB — VITAMIN B12: Vitamin B-12: >1501 pg/mL — ABNORMAL HIGH (ref 211–911)

## 2023-02-11 LAB — MAGNESIUM: Magnesium: 2.2 mg/dL (ref 1.5–2.5)

## 2023-02-11 LAB — CK: Total CK: 131 U/L (ref 7–177)

## 2023-02-11 NOTE — Progress Notes (Signed)
Cramps for years, leg, episodic, L>R leg.  Unclear if more at night.  Usually L lateral shin.  Some occ hand cramping.  Leg cramping doesn't happen with walking.  No recurrent claudication.  No clear trigger known for the events.  No FCNAVD.  No blood in urine or stool.  When she has a cramp, stretching and walking can help.  Tried changing shoes w/o resolution.  Not daily but ~3 times per week.  No new supplements. Not SOB.  Not CP.   Meds, vitals, and allergies reviewed.   ROS: Per HPI unless specifically indicated in ROS section   GEN: nad, alert and oriented HEENT: ncat NECK: supple w/o LA CV: rrr. PULM: ctab, no inc wob EXT: no edema SKIN: no acute rash Normal DP pulse B, normal foot sensation and calf not ttp B

## 2023-02-11 NOTE — Patient Instructions (Signed)
Go to the lab on the way out.   If you have mychart we'll likely use that to update you.    Take care.  Glad to see you. 

## 2023-02-14 ENCOUNTER — Other Ambulatory Visit: Payer: Self-pay | Admitting: Family Medicine

## 2023-02-14 DIAGNOSIS — M791 Myalgia, unspecified site: Secondary | ICD-10-CM | POA: Insufficient documentation

## 2023-02-14 NOTE — Assessment & Plan Note (Signed)
Episodic cramps without claudication.  Unclear source.  See notes on labs.  Okay for outpatient follow-up.

## 2023-02-22 ENCOUNTER — Ambulatory Visit (INDEPENDENT_AMBULATORY_CARE_PROVIDER_SITE_OTHER): Payer: Medicare Other | Admitting: Dermatology

## 2023-02-22 ENCOUNTER — Encounter: Payer: Self-pay | Admitting: Dermatology

## 2023-02-22 VITALS — BP 93/62 | HR 74

## 2023-02-22 DIAGNOSIS — S30861A Insect bite (nonvenomous) of abdominal wall, initial encounter: Secondary | ICD-10-CM | POA: Diagnosis not present

## 2023-02-22 DIAGNOSIS — L7211 Pilar cyst: Secondary | ICD-10-CM

## 2023-02-22 DIAGNOSIS — W57XXXA Bitten or stung by nonvenomous insect and other nonvenomous arthropods, initial encounter: Secondary | ICD-10-CM | POA: Diagnosis not present

## 2023-02-22 NOTE — Progress Notes (Signed)
   New Patient Visit   Subjective  Jeanne Webb is a 72 y.o. female who presents for the following: growth on scalp  Pt has a growth on the top of her scalp that has been present for several years. Spot has not changed much. It is not painful or bothersome. She saw her pcp who stated spot was a blackhead and that pt could just squeeze it herself. She did in  November and got some white material out of it but could not get everything out. It did start to refill in the past few months. Pt would like to have evaluated and treatment option discussed. Pt also has possible insect bites on left lower leg and on right side. The spot on the leg has been present for around 4 days and the side came up last night. The spots do not itch but she gets some every few days.She is not putting anything on them but is washing them with dial soap.  The patient has spots, moles and lesions to be evaluated, some may be new or changing and the patient may have concern these could be cancer.   The following portions of the chart were reviewed this encounter and updated as appropriate: medications, allergies, medical history  Review of Systems:  No other skin or systemic complaints except as noted in HPI or Assessment and Plan.  Objective  Well appearing patient in no apparent distress; mood and affect are within normal limits.   A focused examination was performed of the following areas: Head, leg and trunk  Relevant exam findings are noted in the Assessment and Plan.  Exam: 4 pink pruritic papules consistent with bug bite  Mid Parietal Scalp 5mm firm subcutaneous nodule                Assessment & Plan   1. 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 -Aftercare: wash scalp after 48 hours, apply Vaseline/Aquaphor  daily  2. Bug Bites - Assessment: Pink papules on right torso appear consistent with bug bites. - Plan: Patient declined prescription corticosteroids as lesions are not itchy. Treatment plan includes aggressive topical moisturization and increased awareness of the patient's environment when lesions reappear.   Pilar cyst Mid Parietal Scalp  Skin excision - Mid Parietal Scalp  Timeout: patient name, date of birth, surgical site, and procedure verified   Procedure prep:  Patient was prepped and draped in usual sterile fashion Prep type:  Isopropyl alcohol Instrument used comment:  6mm punch Hemostasis achieved with: suture    Specimen 1 - Surgical pathology Differential Diagnosis: R/O pilar cyst  Check Margins: No        Return in about 2 weeks (around 03/08/2023) for SUTURE REMOVAL.  Owens Shark, CMA, am acting as scribe for [employer], DO.   Documentation: I have reviewed the above documentation for accuracy and completeness, and I agree with the above.  Langston Reusing, DO

## 2023-02-22 NOTE — Patient Instructions (Addendum)

## 2023-02-23 ENCOUNTER — Ambulatory Visit (INDEPENDENT_AMBULATORY_CARE_PROVIDER_SITE_OTHER): Payer: Medicare Other

## 2023-02-23 VITALS — Ht 65.0 in | Wt 110.0 lb

## 2023-02-23 DIAGNOSIS — Z780 Asymptomatic menopausal state: Secondary | ICD-10-CM

## 2023-02-23 DIAGNOSIS — Z0000 Encounter for general adult medical examination without abnormal findings: Secondary | ICD-10-CM | POA: Diagnosis not present

## 2023-02-23 LAB — SURGICAL PATHOLOGY

## 2023-02-23 NOTE — Progress Notes (Signed)
 Because this visit was a virtual/telehealth visit,  certain criteria was not obtained, such a blood pressure, CBG if applicable, and timed get up and go. Any medications not marked as "taking" were not mentioned during the medication reconciliation part of the visit. Any vitals not documented were not able to be obtained due to this being a telehealth visit or patient was unable to self-report a recent blood pressure reading due to a lack of equipment at home via telehealth. Vitals that have been documented are verbally provided by the patient.   Subjective:   Jeanne Webb is a 72 y.o. female who presents for Medicare Annual (Subsequent) preventive examination.  Visit Complete: Virtual  I connected with  Minerva Areola on 02/23/23 by a audio enabled telemedicine application and verified that I am speaking with the correct person using two identifiers.  Patient Location: Home  Provider Location: Home Office  I discussed the limitations of evaluation and management by telemedicine. The patient expressed understanding and agreed to proceed.  Patient Medicare AWV questionnaire was completed by the patient on n/a; I have confirmed that all information answered by patient is correct and no changes since this date.  Cardiac Risk Factors include: advanced age (>50men, >76 women)     Objective:    Today's Vitals   02/23/23 1117  Weight: 110 lb (49.9 kg)  Height: 5\' 5"  (1.651 m)   Body mass index is 18.3 kg/m.     02/23/2023   11:01 AM 09/01/2022    7:39 AM 12/03/2021    8:21 AM 08/05/2020    2:16 PM 05/27/2020    8:53 AM 06/01/2019    8:19 AM 11/09/2017    3:30 PM  Advanced Directives  Does Patient Have a Medical Advance Directive? No Yes Yes Yes Yes No Yes  Type of Special educational needs teacher of [place];Living will Healthcare Power of [place];Living will Healthcare Power of [place];Living will Living will;Healthcare Power of [place];Out of facility DNR (pink MOST or yellow  form)  Living will  Does patient want to make changes to medical advance directive?  No - Patient declined  No - Patient declined No - Patient declined  No - Patient declined  Copy of Healthcare Power of Attorney in Chart?  No - copy requested No - copy requested No - copy requested No - copy requested    Would patient like information on creating a medical advance directive? No - Patient declined     Yes (Inpatient - patient defers creating a medical advance directive and declines information at this time)     Current Medications (verified) Outpatient Encounter Medications as of 02/23/2023  Medication Sig   Ascorbic Acid (VITAMIN C) 1000 MG tablet Take 2,000 mg by mouth daily.   Calcium-Magnesium-Zinc (CAL-MAG-ZINC PO) Take 2 tablets by mouth 2 (two) times daily.   Cholecalciferol (VITAMIN D3) 25 MCG (1000 UT) CAPS Take 1 capsule (1,000 Units total) by mouth in the morning and at bedtime.   Glucosamine HCl (GLUCOSAMINE PO) Take by mouth.   VITAMIN A PO Take by mouth daily.   vitamin E 400 UNIT capsule Take 400 Units by mouth daily.    Zinc 40 MG TABS Take by mouth.   No facility-administered encounter medications on file as of 02/23/2023.    Allergies (verified) Water oral [alimentum], Lactose intolerance (gi), Nickel, Other, Seldane [terfenadine], and Penicillins   History: Past Medical History:  Diagnosis Date   Allergy    Arthritis    COVID-19 08/10/2022  Symptoms resolved 08/17/22   Cramp of limb    Osteoporosis    Plantar fascial fibromatosis    Rotator cuff tear    right   Past Surgical History:  Procedure Laterality Date   CATARACT EXTRACTION W/PHACO Left 09/01/2022   Procedure: CATARACT EXTRACTION PHACO AND INTRAOCULAR LENS PLACEMENT (IOC) LEFT  6.78  00:44.3;  Surgeon: Galen Manila, MD;  Location: [facility];  Service: Ophthalmology;  Laterality: Left;   DENTAL SURGERY     squamous cell      Removal from leg   TOTAL HIP ARTHROPLASTY Left 11/09/2017    Procedure: LEFT TOTAL HIP ARTHROPLASTY ANTERIOR APPROACH;  Surgeon: Durene Romans, MD;  Location: [facility];  Service: Orthopedics;  Laterality: Left;   Family History  Problem Relation Age of Onset   Cancer Mother        leukemia, ovarian, breast   Stroke Father    Heart disease Brother    Colon cancer Neg Hx    Social History   Socioeconomic History   Marital status: Married    Spouse name: Not on file   Number of children: Not on file   Years of education: Not on file   Highest education level: Not on file  Occupational History   Not on file  Tobacco Use   Smoking status: Former    Current packs/day: 0.00    Types: Cigarettes    Start date: 35    Quit date: 43    Years since quitting: 50.7   Smokeless tobacco: Never  Vaping Use   Vaping status: Never Used  Substance and Sexual Activity   Alcohol use: No    Alcohol/week: 0.0 standard drinks of alcohol   Drug use: No   Sexual activity: Not Currently  Other Topics Concern   Not on file  Social History Narrative   From [place], lived in [place].   Artist (painting, realism), owns rental properties.     Married 1974   6 grankids   Social Determinants of Corporate investment banker Strain: Low Risk  (02/23/2023)   Overall Financial Resource Strain (CARDIA)    Difficulty of Paying Living Expenses: Not hard at all  Food Insecurity: No Food Insecurity (02/23/2023)   Hunger Vital Sign    Worried About Running Out of Food in the Last Year: Never true    Ran Out of Food in the Last Year: Never true  Transportation Needs: No Transportation Needs (02/23/2023)   PRAPARE - Administrator, Civil Service (Medical): No    Lack of Transportation (Non-Medical): No  Physical Activity: Sufficiently Active (02/23/2023)   Exercise Vital Sign    Days of Exercise per Week: 7 days    Minutes of Exercise per Session: 30 min  Stress: No Stress Concern Present (02/23/2023)   [employer] of Occupational Health -  Occupational Stress Questionnaire    Feeling of Stress : Not at all  Social Connections: Moderately Integrated (02/23/2023)   Social Connection and Isolation Panel [NHANES]    Frequency of Communication with Friends and Family: More than three times a week    Frequency of Social Gatherings with Friends and Family: More than three times a week    Attends Religious Services: More than 4 times per year    Active Member of [employer] or Organizations: No    Attends Banker Meetings: Never    Marital Status: Married    Tobacco Counseling Counseling given: Yes   Clinical Intake:  Pre-visit preparation completed: Yes  Pain : No/denies pain     BMI - recorded: 18.3 Nutritional Status: BMI <19  Underweight Nutritional Risks: None Diabetes: No  How often do you need to have someone help you when you read instructions, pamphlets, or other written materials from your doctor or pharmacy?: 1 - Never  Interpreter Needed?: No  Information entered by ::  Keiri Solano, CMA   Activities of Daily Living    02/23/2023   11:09 AM 09/01/2022    7:32 AM  In your present state of health, do you have any difficulty performing the following activities:  Hearing? 0 0  Vision? 0 0  Difficulty concentrating or making decisions? 0 0  Walking or climbing stairs? 0 0  Dressing or bathing? 0 0  Doing errands, shopping? 0   Preparing Food and eating ? N   Using the Toilet? N   In the past six months, have you accidently leaked urine? N   Do you have problems with loss of bowel control? N   Managing your Medications? N   Managing your Finances? N   Housekeeping or managing your Housekeeping? N     Patient Care Team: Joaquim Nam, MD as PCP - General (Family Medicine) Kermit Balo, DO (Geriatric Medicine) Elmon Else, MD as Consulting Physician (Dermatology)  Indicate any recent Medical Services you may have received from other than [facility] providers in the past year (date may be  approximate).     Assessment:   This is a routine wellness examination for Jeanne Webb.  Hearing/Vision screen Hearing Screening - Comments:: Patient denies any hearing difficulties.   Vision Screening - Comments:: Wears rx glasses - up to date with routine eye exams     Goals Addressed             This Visit's Progress    Patient Stated       To get everything in its very own place and get organized       Depression Screen    02/23/2023   11:05 AM 02/11/2023    9:50 AM 10/05/2022    4:16 PM 12/03/2021    8:24 AM 05/27/2020    8:45 AM 06/01/2019    8:15 AM 05/24/2019    9:22 AM  PHQ 2/9 Scores  PHQ - 2 Score 0 0 0 0 0 0 0  PHQ- 9 Score 0 0 0        Fall Risk    02/23/2023   11:24 AM 02/11/2023    9:49 AM 10/05/2022    4:16 PM 12/03/2021    8:22 AM 05/27/2020    8:44 AM  Fall Risk   Falls in the past year? 0 0 0 1 1  Comment    walking too fast Tripped while walking fast with Dog. States that her face was bruised up.  Number falls in past yr: 0 0 0 0 0  Injury with Fall? 0 0 0 0 1  Risk for fall due to : No Fall Risks No Fall Risks No Fall Risks No Fall Risks   Follow up Falls prevention discussed Falls evaluation completed Falls evaluation completed Falls evaluation completed;Education provided;Falls prevention discussed     MEDICARE RISK AT HOME: Medicare Risk at Home Any stairs in or around the home?: No If so, are there any without handrails?: No Home free of loose throw rugs in walkways, pet beds, electrical cords, etc?: Yes Adequate lighting in your home to reduce risk of falls?:  Yes Life alert?: No Use of a cane, walker or w/c?: No Grab bars in the bathroom?: No Shower chair or bench in shower?: No Elevated toilet seat or a handicapped toilet?: Yes  TIMED UP AND GO:  Was the test performed?  No    Cognitive Function:    02/10/2017    9:37 AM 01/24/2016    9:39 AM 01/11/2015    9:34 AM  MMSE - Mini Mental State Exam  Orientation to time 5 5 5    Orientation to Place 5 5 5   Registration 3 3 3   Attention/ Calculation 5 5 5   Recall 3 3 3   Language- name 2 objects 2 2 2   Language- repeat 1 1 1   Language- follow 3 step command 3 3 3   Language- read & follow direction 1 1 1   Write a sentence 1 1 1   Copy design 1 1 1   Total score 30 30 30         02/23/2023   11:04 AM 12/03/2021    8:26 AM 05/27/2020    8:46 AM 05/24/2019    9:31 AM  6CIT Screen  What Year? 0 points 0 points 0 points 0 points  What month? 0 points 0 points 0 points 0 points  What time? 0 points 0 points 0 points 0 points  Count back from 20 0 points 0 points 0 points 0 points  Months in reverse 0 points 0 points 0 points 0 points  Repeat phrase 0 points 0 points 0 points 0 points  Total Score 0 points 0 points 0 points 0 points    Immunizations Immunization History  Administered Date(s) Administered   PFIZER(Purple Top)SARS-COV-2 Vaccination 01/12/2020, 01/31/2020    TDAP status: Due, Education has been provided regarding the importance of this vaccine. Advised may receive this vaccine at local pharmacy or Health Dept. Aware to provide a copy of the vaccination record if obtained from local pharmacy or Health Dept. Verbalized acceptance and understanding.  Patient not a candidate for TDAP vaccine due to medical history  Patient not a candidate for Pneumonia Vaccine due to medical history  Covid-19 vaccine status: Declined, Education has been provided regarding the importance of this vaccine but patient still declined. Advised may receive this vaccine at local pharmacy or Health Dept.or vaccine clinic. Aware to provide a copy of the vaccination record if obtained from local pharmacy or Health Dept. Verbalized acceptance and understanding.  Qualifies for Shingles Vaccine? Yes   Zostavax completed No   Shingrix Completed?: No.    Education has been provided regarding the importance of this vaccine. Patient has been advised to call insurance company to  determine out of pocket expense if they have not yet received this vaccine. Advised may also receive vaccine at local pharmacy or Health Dept. Verbalized acceptance and understanding.  Screening Tests Health Maintenance  Topic Date Due   DTaP/Tdap/Td (1 - Tdap) Never done   Colonoscopy  Never done   Zoster Vaccines- Shingrix (1 of 2) Never done   Pneumonia Vaccine 75+ Years old (1 of 1 - PCV) Never done   DEXA SCAN  06/27/2022   COVID-19 Vaccine (3 - 2023-24 season) 03/06/2028 (Originally 02/07/2023)   MAMMOGRAM  07/04/2023   Medicare Annual Wellness (AWV)  02/23/2024   Hepatitis C Screening  Completed   HPV VACCINES  Aged Out    Health Maintenance  Health Maintenance Due  Topic Date Due   DTaP/Tdap/Td (1 - Tdap) Never done   Colonoscopy  Never done  Zoster Vaccines- Shingrix (1 of 2) Never done   Pneumonia Vaccine 32+ Years old (1 of 1 - PCV) Never done   DEXA SCAN  06/27/2022    Colorectal Cancer Screening: Patient declined colorectal cancer screening   Mammogram status: Completed 07/03/2022. Repeat every year  Bone Density status: Ordered 02/23/2023. Pt provided with contact info and advised to call to schedule appt.  Lung Cancer Screening: (Low Dose CT Chest recommended if Age 1-80 years, 20 pack-year currently smoking OR have quit w/in 15years.) does not qualify.    Additional Screening:  Hepatitis C Screening: does not qualify; Completed 02/10/2017  Vision Screening: Recommended annual ophthalmology exams for early detection of glaucoma and other disorders of the eye. Is the patient up to date with their annual eye exam?  Yes  Who is the provider or what is the name of the office in which the patient attends annual eye exams? Galen Manila, MD  Dental Screening: Recommended annual dental exams for proper oral hygiene  Diabetic Foot Exam: na  Community Resource Referral / Chronic Care Management: CRR required this visit?  No   CCM required this visit?  No      Plan:     I have personally reviewed and noted the following in the patient's chart:   Medical and social history Use of alcohol, tobacco or illicit drugs  Current medications and supplements including opioid prescriptions. Patient is not currently taking opioid prescriptions. Functional ability and status Nutritional status Physical activity Advanced directives List of other physicians Hospitalizations, surgeries, and ER visits in previous 12 months Vitals Screenings to include cognitive, depression, and falls Referrals and appointments  In addition, I have reviewed and discussed with patient certain preventive protocols, quality metrics, and best practice recommendations. A written personalized care plan for preventive services as well as general preventive health recommendations were provided to patient.     Jordan Hawks Tyrece Vanterpool, CMA   02/23/2023   After Visit Summary: (Mail) Due to this being a telephonic visit, the after visit summary with patients personalized plan was offered to patient via mail   Nurse Notes:

## 2023-02-23 NOTE — Patient Instructions (Addendum)
Jeanne Webb , Thank you for taking time to come for your Medicare Wellness Visit. I appreciate your ongoing commitment to your health goals. Please review the following plan we discussed and let me know if I can assist you in the future.   Referrals/Orders/Follow-Ups/Clinician Recommendations:  You have an order for:  []   2D Mammogram  [x]   3D Mammogram  []   Bone Density     Please call for appointment:   [facility] [address] [place], [place] [address] [phone]  Make sure to wear two-piece clothing.  No lotions powders or deodorants the day of the appointment Make sure to bring picture ID and insurance card.  Bring list of medications you are currently taking including any supplements.   Schedule your [place] screening mammogram through MyChart!   Log into your MyChart account.  Go to 'Visit' (or 'Appointments' if on mobile App) --> Schedule an Appointment  Under 'Select a Reason for Visit' choose the Mammogram Screening option.  Complete the pre-visit questions and select the time and place that best fits your schedule.  Your next Annual Wellness Visit will be on March 01, 2024 at 10:00am. This will be a virtual visit.    This is a list of the screening recommended for you and due dates:  Health Maintenance  Topic Date Due   DTaP/Tdap/Td vaccine (1 - Tdap) Never done   Colon Cancer Screening  Never done   Zoster (Shingles) Vaccine (1 of 2) Never done   Pneumonia Vaccine (1 of 1 - PCV) Never done   DEXA scan (bone density measurement)  06/27/2022   COVID-19 Vaccine (3 - 2023-24 season) 03/06/2028*   Mammogram  07/04/2023   Medicare Annual Wellness Visit  02/23/2024   Hepatitis C Screening  Completed   HPV Vaccine  Aged Out  *Topic was postponed. The date shown is not the original due date.    Advanced directives: (Copy Requested) Please bring a copy of your health care power of attorney and living will to the office to be added to  your chart at your convenience.  Next Medicare Annual Wellness Visit scheduled for next year: Yes  Preventive Care 42 Years and Older, Female Preventive care refers to lifestyle choices and visits with your health care provider that can promote health and wellness. Preventive care visits are also called wellness exams. What can I expect for my preventive care visit? Counseling Your health care provider may ask you questions about your: Medical history, including: Past medical problems. Family medical history. Pregnancy and menstrual history. History of falls. Current health, including: Memory and ability to understand (cognition). Emotional well-being. Home life and relationship well-being. Sexual activity and sexual health. Lifestyle, including: Alcohol, nicotine or tobacco, and drug use. Access to firearms. Diet, exercise, and sleep habits. Work and work Astronomer. Sunscreen use. Safety issues such as seatbelt and bike helmet use. Physical exam Your health care provider will check your: Height and weight. These may be used to calculate your BMI (body mass index). BMI is a measurement that tells if you are at a healthy weight. Waist circumference. This measures the distance around your waistline. This measurement also tells if you are at a healthy weight and may help predict your risk of certain diseases, such as type 2 diabetes and high blood pressure. Heart rate and blood pressure. Body temperature. Skin for abnormal spots. What immunizations do I need?  Vaccines are usually given at various ages, according to a schedule. Your health  care provider will recommend vaccines for you based on your age, medical history, and lifestyle or other factors, such as travel or where you work. What tests do I need? Screening Your health care provider may recommend screening tests for certain conditions. This may include: Lipid and cholesterol levels. Hepatitis C test. Hepatitis B  test. HIV (human immunodeficiency virus) test. STI (sexually transmitted infection) testing, if you are at risk. Lung cancer screening. Colorectal cancer screening. Diabetes screening. This is done by checking your blood sugar (glucose) after you have not eaten for a while (fasting). Mammogram. Talk with your health care provider about how often you should have regular mammograms. BRCA-related cancer screening. This may be done if you have a family history of breast, ovarian, tubal, or peritoneal cancers. Bone density scan. This is done to screen for osteoporosis. Talk with your health care provider about your test results, treatment options, and if necessary, the need for more tests. Follow these instructions at home: Eating and drinking  Eat a diet that includes fresh fruits and vegetables, whole grains, lean protein, and low-fat dairy products. Limit your intake of foods with high amounts of sugar, saturated fats, and salt. Take vitamin and mineral supplements as recommended by your health care provider. Do not drink alcohol if your health care provider tells you not to drink. If you drink alcohol: Limit how much you have to 0-1 drink a day. Know how much alcohol is in your drink. In the [place] one drink equals one 12 oz bottle of beer (355 mL), one 5 oz glass of wine (148 mL), or one 1 oz glass of hard liquor (44 mL). Lifestyle Brush your teeth every morning and night with fluoride toothpaste. Floss one time each day. Exercise for at least 30 minutes 5 or more days each week. Do not use any products that contain nicotine or tobacco. These products include cigarettes, chewing tobacco, and vaping devices, such as e-cigarettes. If you need help quitting, ask your health care provider. Do not use drugs. If you are sexually active, practice safe sex. Use a condom or other form of protection in order to prevent STIs. Take aspirin only as told by your health care provider. Make sure that you  understand how much to take and what form to take. Work with your health care provider to find out whether it is safe and beneficial for you to take aspirin daily. Ask your health care provider if you need to take a cholesterol-lowering medicine (statin). Find healthy ways to manage stress, such as: Meditation, yoga, or listening to music. Journaling. Talking to a trusted person. Spending time with friends and family. Minimize exposure to UV radiation to reduce your risk of skin cancer. Safety Always wear your seat belt while driving or riding in a vehicle. Do not drive: If you have been drinking alcohol. Do not ride with someone who has been drinking. When you are tired or distracted. While texting. If you have been using any mind-altering substances or drugs. Wear a helmet and other protective equipment during sports activities. If you have firearms in your house, make sure you follow all gun safety procedures. What's next? Visit your health care provider once a year for an annual wellness visit. Ask your health care provider how often you should have your eyes and teeth checked. Stay up to date on all vaccines. This information is not intended to replace advice given to you by your health care provider. Make sure you discuss any questions you have  with your health care provider. Document Revised: 11/20/2020 Document Reviewed: 11/20/2020 Elsevier Patient Education  2024 [employer]. Understanding Your Risk for Falls Millions of people have serious injuries from falls each year. It is important to understand your risk of falling. Talk with your health care provider about your risk and what you can do to lower it. If you do have a serious fall, make sure to tell your provider. Falling once raises your risk of falling again. How can falls affect me? Serious injuries from falls are common. These include: Broken bones, such as hip fractures. Head injuries, such as traumatic brain  injuries (TBI) or concussions. A fear of falling can cause you to avoid activities and stay at home. This can make your muscles weaker and raise your risk for a fall. What can increase my risk? There are a number of risk factors that increase your risk for falling. The more risk factors you have, the higher your risk of falling. Serious injuries from a fall happen most often to people who are older than 74 years old. Teenagers and young adults ages 17-29 are also at higher risk. Common risk factors include: Weakness in the lower body. Being generally weak or confused due to long-term (chronic) illness. Dizziness or balance problems. Poor vision. Medicines that cause dizziness or drowsiness. These may include: Medicines for your blood pressure, heart, anxiety, insomnia, or swelling (edema). Pain medicines. Muscle relaxants. Other risk factors include: Drinking alcohol. Having had a fall in the past. Having foot pain or wearing improper footwear. Working at a dangerous job. Having any of the following in your home: Tripping hazards, such as floor clutter or loose rugs. Poor lighting. Pets. Having dementia or memory loss. What actions can I take to lower my risk of falling?     Physical activity Stay physically fit. Do strength and balance exercises. Consider taking a regular class to build strength and balance. Yoga and tai chi are good options. Vision Have your eyes checked every year and your prescription for glasses or contacts updated as needed. Shoes and walking aids Wear non-skid shoes. Wear shoes that have rubber soles and low heels. Do not wear high heels. Do not walk around the house in socks or slippers. Use a cane or walker as told by your provider. Home safety Attach secure railings on both sides of your stairs. Install grab bars for your bathtub, shower, and toilet. Use a non-skid mat in your bathtub or shower. Attach bath mats securely with double-sided, non-slip  rug tape. Use good lighting in all rooms. Keep a flashlight near your bed. Make sure there is a clear path from your bed to the bathroom. Use night-lights. Do not use throw rugs. Make sure all carpeting is taped or tacked down securely. Remove all clutter from walkways and stairways, including extension cords. Repair uneven or broken steps and floors. Avoid walking on icy or slippery surfaces. Walk on the grass instead of on icy or slick sidewalks. Use ice melter to get rid of ice on walkways in the winter. Use a cordless phone. Questions to ask your health care provider Can you help me check my risk for a fall? Do any of my medicines make me more likely to fall? Should I take a vitamin D supplement? What exercises can I do to improve my strength and balance? Should I make an appointment to have my vision checked? Do I need a bone density test to check for weak bones (osteoporosis)? Would it help to  use a cane or a walker? Where to find more information Centers for Disease Control and Prevention, STEADI: TonerPromos.no Community-Based Fall Prevention Programs: TonerPromos.no [employer] on Aging: BaseRingTones.pl Contact a health care provider if: You fall at home. You are afraid of falling at home. You feel weak, drowsy, or dizzy. This information is not intended to replace advice given to you by your health care provider. Make sure you discuss any questions you have with your health care provider. Document Revised: 01/26/2022 Document Reviewed: 01/26/2022 Elsevier Patient Education  2024 Elsevier Inc. Bone Density Test A bone density test uses a type of X-ray to measure the amount of calcium and other minerals in a person's bones. It can measure bone density in the hip and the spine. The test is similar to having a regular X-ray. This test may also be called: Bone densitometry. Bone mineral density test. Dual-energy X-ray absorptiometry (DEXA). You may have this test to: Diagnose a condition  that causes weak or thin bones (osteoporosis). Screen you for osteoporosis. Predict your risk for a broken bone (fracture). Determine how well your osteoporosis treatment is working. Tell a health care provider about: Any allergies you have. All medicines you are taking, including vitamins, herbs, eye drops, creams, and over-the-counter medicines. Any problems you or family members have had with anesthetic medicines. Any blood disorders you have. Any surgeries you have had. Any medical conditions you have. Whether you are pregnant or may be pregnant. Any medical tests you have had within the past 14 days that used contrast material. What are the risks? Generally, this is a safe test. However, it does expose you to a small amount of radiation, which can slightly increase your cancer risk. What happens before the test? Do not take any calcium supplements within the 24 hours before your test. You will need to remove all metal jewelry, eyeglasses, removable dental appliances, and any other metal objects on your body. What happens during the test?  You will lie down on an exam table. There will be an X-ray generator below you and an imaging device above you. Other devices, such as boxes or braces, may be used to position your body properly for the scan. The machine will slowly scan your body. You will need to keep very still while the machine does the scan. The images will show up on a screen in the room. Images will be examined by a specialist after your test is finished. The procedure may vary among health care providers and hospitals. What can I expect after the test? It is up to you to get the results of your test. Ask your health care provider, or the department that is doing the test, when your results will be ready. Summary A bone density test is an imaging test that uses a type of X-ray to measure the amount of calcium and other minerals in your bones. The test may be used to diagnose  or screen you for a condition that causes weak or thin bones (osteoporosis), predict your risk for a broken bone (fracture), or determine how well your osteoporosis treatment is working. Do not take any calcium supplements within 24 hours before your test. Ask your health care provider, or the department that is doing the test, when your results will be ready. This information is not intended to replace advice given to you by your health care provider. Make sure you discuss any questions you have with your health care provider. Document Revised: 02/05/2021 Document Reviewed: 11/09/2019 Elsevier  Patient Education  2024 [employer].

## 2023-03-08 ENCOUNTER — Ambulatory Visit (INDEPENDENT_AMBULATORY_CARE_PROVIDER_SITE_OTHER): Payer: Medicare Other | Admitting: Dermatology

## 2023-03-08 DIAGNOSIS — Z4802 Encounter for removal of sutures: Secondary | ICD-10-CM

## 2023-03-08 NOTE — Progress Notes (Signed)
   Follow-Up Visit   Subjective  Jeanne Webb is a 72 y.o. female who presents for the following: Suture removal  Pathology showed pilar cyst   The following portions of the chart were reviewed this encounter and updated as appropriate: medications, allergies, medical history  Review of Systems:  No other skin or systemic complaints except as noted in HPI or Assessment and Plan.  Objective  Well appearing patient in no apparent distress; mood and affect are within normal limits.  Areas Examined: Vertex of scalp Relevant physical exam findings are noted in the Assessment and Plan.    Assessment & Plan    Encounter for Removal of Sutures - Incision site is clean, dry and intact. - Wound cleansed, sutures removed, wound cleansed and steri strips applied.  - Discussed pathology results showing pilar cyst at vertex scalp - Patient advised to keep steri-strips dry until they fall off. - Scars remodel for a full year. - Once steri-strips fall off, patient can apply over-the-counter silicone scar cream once to twice a day to help with scar remodeling if desired. - Patient advised to call with any concerns or if they notice any new or changing lesions.  No follow-ups on file.  I, Asher Muir, CMA, am acting as scribe for [employer], DO.   Documentation: I have reviewed the above documentation for accuracy and completeness, and I agree with the above.  Langston Reusing, DO

## 2023-03-11 DIAGNOSIS — R051 Acute cough: Secondary | ICD-10-CM | POA: Diagnosis not present

## 2023-03-11 DIAGNOSIS — R531 Weakness: Secondary | ICD-10-CM | POA: Diagnosis not present

## 2023-03-11 DIAGNOSIS — Z03818 Encounter for observation for suspected exposure to other biological agents ruled out: Secondary | ICD-10-CM | POA: Diagnosis not present

## 2023-03-11 DIAGNOSIS — R0602 Shortness of breath: Secondary | ICD-10-CM | POA: Diagnosis not present

## 2023-04-01 DIAGNOSIS — H2511 Age-related nuclear cataract, right eye: Secondary | ICD-10-CM | POA: Diagnosis not present

## 2023-04-01 DIAGNOSIS — Z961 Presence of intraocular lens: Secondary | ICD-10-CM | POA: Diagnosis not present

## 2023-06-30 ENCOUNTER — Encounter: Payer: Self-pay | Admitting: Dermatology

## 2023-06-30 ENCOUNTER — Ambulatory Visit: Payer: Medicare Other | Admitting: Dermatology

## 2023-06-30 VITALS — BP 143/87 | HR 87

## 2023-06-30 DIAGNOSIS — B351 Tinea unguium: Secondary | ICD-10-CM

## 2023-06-30 DIAGNOSIS — L578 Other skin changes due to chronic exposure to nonionizing radiation: Secondary | ICD-10-CM | POA: Diagnosis not present

## 2023-06-30 DIAGNOSIS — L821 Other seborrheic keratosis: Secondary | ICD-10-CM

## 2023-06-30 DIAGNOSIS — L814 Other melanin hyperpigmentation: Secondary | ICD-10-CM | POA: Diagnosis not present

## 2023-06-30 DIAGNOSIS — Z1283 Encounter for screening for malignant neoplasm of skin: Secondary | ICD-10-CM | POA: Diagnosis not present

## 2023-06-30 DIAGNOSIS — D1801 Hemangioma of skin and subcutaneous tissue: Secondary | ICD-10-CM

## 2023-06-30 DIAGNOSIS — W908XXA Exposure to other nonionizing radiation, initial encounter: Secondary | ICD-10-CM

## 2023-06-30 DIAGNOSIS — D229 Melanocytic nevi, unspecified: Secondary | ICD-10-CM

## 2023-06-30 NOTE — Progress Notes (Signed)
    Follow-Up Visit   Subjective  Jeanne Webb is a 73 y.o. female who presents for the following: Skin Cancer Screening and Full Body Skin Exam.  No hx of skin cancer and no hx family hx of skin cancer.  The patient presents for Total-Body Skin Exam (TBSE) for skin cancer screening and mole check. The patient has spots, moles and lesions to be evaluated, some may be new or changing and the patient may have concern these could be cancer.    The following portions of the chart were reviewed this encounter and updated as appropriate: medications, allergies, medical history  Review of Systems:  No other skin or systemic complaints except as noted in HPI or Assessment and Plan.  Objective  Well appearing patient in no apparent distress; mood and affect are within normal limits.  A full examination was performed including scalp, head, eyes, ears, nose, lips, neck, chest, axillae, abdomen, back, buttocks, bilateral upper extremities, bilateral lower extremities, hands, feet, fingers, toes, fingernails, and toenails. All findings within normal limits unless otherwise noted below.   Relevant physical exam findings are noted in the Assessment and Plan.  Right 2nd Toenail, Right 3rd Distal Dorsal Toe, Right 4th Toenail Vinegar soaks, 1 part vinegar to 1 part water. Soak nails for 10 mins then rinse with water.  Continue every other day for 4 months or more until completely clear.  Assessment & Plan   Scalp looks clear.  Biopsy site is well healed.  SKIN CANCER SCREENING PERFORMED TODAY.  ACTINIC DAMAGE - Chronic condition, secondary to cumulative UV/sun exposure - diffuse scaly erythematous macules with underlying dyspigmentation - Recommend daily broad spectrum sunscreen SPF 30+ to sun-exposed areas, reapply every 2 hours as needed.  - Staying in the shade or wearing long sleeves, sun glasses (UVA+UVB protection) and wide brim hats (4-inch brim around the entire circumference of the hat)  are also recommended for sun protection.  - Call for new or changing lesions.  LENTIGINES, SEBORRHEIC KERATOSES, HEMANGIOMAS - Benign normal skin lesions - Benign-appearing - Call for any changes  MELANOCYTIC NEVI - Tan-brown and/or pink-flesh-colored symmetric macules and papules - Benign appearing on exam today - Observation - Call clinic for new or changing moles - Recommend daily use of broad spectrum spf 30+ sunscreen to sun-exposed areas.   ONYCHOMYCOSIS Exam: Thickened toenails with subungal debris c/w onychomycosis (right foot: 3rd,4th,5th digits)  Treatment Plan: Vinegar soaks, 1 part vinegar to 1 part water. Soak nails for 10 mins then rinse with water.  Continue every other day for 4 months or more until completely clear.     SKIN EXAM FOR MALIGNANT NEOPLASM   MULTIPLE BENIGN MELANOCYTIC NEVI   CHERRY ANGIOMA   LENTIGINES   ONYCHOMYCOSIS (3) Right 2nd Toenail, Right 3rd Distal Dorsal Toe, Right 4th Toenail Return in about 1 year (around 06/29/2024) for TBSC.    Documentation: I have reviewed the above documentation for accuracy and completeness, and I agree with the above.  Langston Reusing, DO

## 2023-06-30 NOTE — Patient Instructions (Addendum)
Hello [place],  Thank you for visiting my office today. Your dedication to maintaining and improving your skin health is greatly appreciated. Below is a summary of the key instructions from today's consultation:  Vinegar Soaks:   Preparation: Mix equal parts of vinegar and warm water.   Procedure: Soak affected toes daily for a few minutes, then rinse off.   Outcome: Consistent treatment is crucial for improvement within 4 months.  Toenail Clippers:   Guideline: Use separate clippers for affected and unaffected toes to prevent spreading the fungus.  Aquaphor Healing Ointment or Vaseline:   Usage: Apply on irritated hair follicles that pop up on legs as needed.  Moisturizing:   Recommendation: Continue to moisturize daily, especially within 10 minutes of showering. Aveeno products are recommended.  Skin Checks:   Frequency: Regularly check your skin and schedule an earlier appointment if you notice any suspicious changes.  Please follow these instructions carefully and do not hesitate to contact the office if you have any questions or concerns. Looking forward to seeing the positive changes at your next appointment.  Warm regards,  Dr. Langston Reusing [facility]   Important Information  Due to recent changes in healthcare laws, you may see results of your pathology and/or laboratory studies on MyChart before the doctors have had a chance to review them. We understand that in some cases there may be results that are confusing or concerning to you. Please understand that not all results are received at the same time and often the doctors may need to interpret multiple results in order to provide you with the best plan of care or course of treatment. Therefore, we ask that you please give [place] 2 business days to thoroughly review all your results before contacting the office for clarification. Should we see a critical lab result, you will be contacted sooner.   If You Need Anything After  Your Visit  If you have any questions or concerns for your doctor, please call our main line at [phone] If no one answers, please leave a voicemail as directed and we will return your call as soon as possible. Messages left after 4 pm will be answered the following business day.   You may also send [place] a message via MyChart. We typically respond to MyChart messages within 1-2 business days.  For prescription refills, please ask your pharmacy to contact our office. Our fax number is [phone].  If you have an urgent issue when the clinic is closed that cannot wait until the next business day, you can page your doctor at the number below.    Please note that while we do our best to be available for urgent issues outside of office hours, we are not available 24/7.   If you have an urgent issue and are unable to reach [place], you may choose to seek medical care at your doctor's office, retail clinic, urgent care center, or emergency room.  If you have a medical emergency, please immediately call 911 or go to the emergency department. In the event of inclement weather, please call our main line at [phone] for an update on the status of any delays or closures.  Dermatology Medication Tips: Please keep the boxes that topical medications come in in order to help keep track of the instructions about where and how to use these. Pharmacies typically print the medication instructions only on the boxes and not directly on the medication tubes.   If your medication is too expensive, please contact our  office at [phone] or send [place] a message through MyChart.   We are unable to tell what your co-pay for medications will be in advance as this is different depending on your insurance coverage. However, we may be able to find a substitute medication at lower cost or fill out paperwork to get insurance to cover a needed medication.   If a prior authorization is required to get your medication covered  by your insurance company, please allow [place] 1-2 business days to complete this process.  Drug prices often vary depending on where the prescription is filled and some pharmacies may offer cheaper prices.  The website www.goodrx.com contains coupons for medications through different pharmacies. The prices here do not account for what the cost may be with help from insurance (it may be cheaper with your insurance), but the website can give you the price if you did not use any insurance.  - You can print the associated coupon and take it with your prescription to the pharmacy.  - You may also stop by our office during regular business hours and pick up a GoodRx coupon card.  - If you need your prescription sent electronically to a different pharmacy, notify our office through [facility] or by phone at [phone]

## 2023-07-14 DIAGNOSIS — Z1231 Encounter for screening mammogram for malignant neoplasm of breast: Secondary | ICD-10-CM | POA: Diagnosis not present

## 2023-07-14 LAB — HM MAMMOGRAPHY

## 2023-07-15 ENCOUNTER — Encounter: Payer: Self-pay | Admitting: Family Medicine

## 2023-11-02 ENCOUNTER — Telehealth: Payer: Self-pay

## 2023-11-02 DIAGNOSIS — M9903 Segmental and somatic dysfunction of lumbar region: Secondary | ICD-10-CM | POA: Diagnosis not present

## 2023-11-02 DIAGNOSIS — M50322 Other cervical disc degeneration at C5-C6 level: Secondary | ICD-10-CM | POA: Diagnosis not present

## 2023-11-02 DIAGNOSIS — M9901 Segmental and somatic dysfunction of cervical region: Secondary | ICD-10-CM | POA: Diagnosis not present

## 2023-11-02 NOTE — Telephone Encounter (Signed)
 Copied from CRM [id number]. Topic: General - Other >> Nov 02, 2023 12:29 PM Earnestine Goes B wrote: Reason for CRM: Dr. Langston Pippins called to speak with dr Vallarie Gauze regarding pt. Requested a call back at [phone] >> Nov 02, 2023 12:32 PM CMA Rocky Cipro C wrote: Wrong office

## 2023-11-03 DIAGNOSIS — M9901 Segmental and somatic dysfunction of cervical region: Secondary | ICD-10-CM | POA: Diagnosis not present

## 2023-11-03 DIAGNOSIS — M9903 Segmental and somatic dysfunction of lumbar region: Secondary | ICD-10-CM | POA: Diagnosis not present

## 2023-11-03 DIAGNOSIS — M50322 Other cervical disc degeneration at C5-C6 level: Secondary | ICD-10-CM | POA: Diagnosis not present

## 2023-11-03 NOTE — Telephone Encounter (Signed)
 No action needed. Per notes Dr. Langston Pippins contacted the wrong office.

## 2023-11-04 DIAGNOSIS — M9901 Segmental and somatic dysfunction of cervical region: Secondary | ICD-10-CM | POA: Diagnosis not present

## 2023-11-04 DIAGNOSIS — M9903 Segmental and somatic dysfunction of lumbar region: Secondary | ICD-10-CM | POA: Diagnosis not present

## 2023-11-04 DIAGNOSIS — M50322 Other cervical disc degeneration at C5-C6 level: Secondary | ICD-10-CM | POA: Diagnosis not present

## 2023-11-05 DIAGNOSIS — M50322 Other cervical disc degeneration at C5-C6 level: Secondary | ICD-10-CM | POA: Diagnosis not present

## 2023-11-05 DIAGNOSIS — M9903 Segmental and somatic dysfunction of lumbar region: Secondary | ICD-10-CM | POA: Diagnosis not present

## 2023-11-05 DIAGNOSIS — M9901 Segmental and somatic dysfunction of cervical region: Secondary | ICD-10-CM | POA: Diagnosis not present

## 2023-11-08 DIAGNOSIS — M9903 Segmental and somatic dysfunction of lumbar region: Secondary | ICD-10-CM | POA: Diagnosis not present

## 2023-11-08 DIAGNOSIS — M9901 Segmental and somatic dysfunction of cervical region: Secondary | ICD-10-CM | POA: Diagnosis not present

## 2023-11-08 DIAGNOSIS — M50322 Other cervical disc degeneration at C5-C6 level: Secondary | ICD-10-CM | POA: Diagnosis not present

## 2023-11-08 NOTE — Telephone Encounter (Signed)
 Late entry.  Talked with Dr. Langston Pippins.  His sig other is pt's daughter.  Concern for memory loss/change.  He/sig other can call for OV for patient and we can go from there.  He agreed.

## 2023-11-09 DIAGNOSIS — M50322 Other cervical disc degeneration at C5-C6 level: Secondary | ICD-10-CM | POA: Diagnosis not present

## 2023-11-09 DIAGNOSIS — M9901 Segmental and somatic dysfunction of cervical region: Secondary | ICD-10-CM | POA: Diagnosis not present

## 2023-11-09 DIAGNOSIS — M9903 Segmental and somatic dysfunction of lumbar region: Secondary | ICD-10-CM | POA: Diagnosis not present

## 2023-11-10 DIAGNOSIS — M9901 Segmental and somatic dysfunction of cervical region: Secondary | ICD-10-CM | POA: Diagnosis not present

## 2023-11-10 DIAGNOSIS — M9903 Segmental and somatic dysfunction of lumbar region: Secondary | ICD-10-CM | POA: Diagnosis not present

## 2023-11-10 DIAGNOSIS — M50322 Other cervical disc degeneration at C5-C6 level: Secondary | ICD-10-CM | POA: Diagnosis not present

## 2023-11-15 DIAGNOSIS — M9903 Segmental and somatic dysfunction of lumbar region: Secondary | ICD-10-CM | POA: Diagnosis not present

## 2023-11-15 DIAGNOSIS — M50322 Other cervical disc degeneration at C5-C6 level: Secondary | ICD-10-CM | POA: Diagnosis not present

## 2023-11-15 DIAGNOSIS — M9901 Segmental and somatic dysfunction of cervical region: Secondary | ICD-10-CM | POA: Diagnosis not present

## 2023-11-17 DIAGNOSIS — M9901 Segmental and somatic dysfunction of cervical region: Secondary | ICD-10-CM | POA: Diagnosis not present

## 2023-11-17 DIAGNOSIS — M50322 Other cervical disc degeneration at C5-C6 level: Secondary | ICD-10-CM | POA: Diagnosis not present

## 2023-11-17 DIAGNOSIS — M9903 Segmental and somatic dysfunction of lumbar region: Secondary | ICD-10-CM | POA: Diagnosis not present

## 2023-11-19 DIAGNOSIS — M9901 Segmental and somatic dysfunction of cervical region: Secondary | ICD-10-CM | POA: Diagnosis not present

## 2023-11-19 DIAGNOSIS — M50322 Other cervical disc degeneration at C5-C6 level: Secondary | ICD-10-CM | POA: Diagnosis not present

## 2023-11-19 DIAGNOSIS — M9903 Segmental and somatic dysfunction of lumbar region: Secondary | ICD-10-CM | POA: Diagnosis not present

## 2023-11-22 DIAGNOSIS — M9903 Segmental and somatic dysfunction of lumbar region: Secondary | ICD-10-CM | POA: Diagnosis not present

## 2023-11-22 DIAGNOSIS — M50322 Other cervical disc degeneration at C5-C6 level: Secondary | ICD-10-CM | POA: Diagnosis not present

## 2023-11-22 DIAGNOSIS — M9901 Segmental and somatic dysfunction of cervical region: Secondary | ICD-10-CM | POA: Diagnosis not present

## 2023-11-23 DIAGNOSIS — M50322 Other cervical disc degeneration at C5-C6 level: Secondary | ICD-10-CM | POA: Diagnosis not present

## 2023-11-23 DIAGNOSIS — M9903 Segmental and somatic dysfunction of lumbar region: Secondary | ICD-10-CM | POA: Diagnosis not present

## 2023-11-23 DIAGNOSIS — M9901 Segmental and somatic dysfunction of cervical region: Secondary | ICD-10-CM | POA: Diagnosis not present

## 2023-11-26 DIAGNOSIS — M50322 Other cervical disc degeneration at C5-C6 level: Secondary | ICD-10-CM | POA: Diagnosis not present

## 2023-11-26 DIAGNOSIS — M9901 Segmental and somatic dysfunction of cervical region: Secondary | ICD-10-CM | POA: Diagnosis not present

## 2023-11-26 DIAGNOSIS — M9903 Segmental and somatic dysfunction of lumbar region: Secondary | ICD-10-CM | POA: Diagnosis not present

## 2023-11-30 DIAGNOSIS — M9903 Segmental and somatic dysfunction of lumbar region: Secondary | ICD-10-CM | POA: Diagnosis not present

## 2023-11-30 DIAGNOSIS — M50322 Other cervical disc degeneration at C5-C6 level: Secondary | ICD-10-CM | POA: Diagnosis not present

## 2023-11-30 DIAGNOSIS — M9901 Segmental and somatic dysfunction of cervical region: Secondary | ICD-10-CM | POA: Diagnosis not present

## 2023-12-02 DIAGNOSIS — M50322 Other cervical disc degeneration at C5-C6 level: Secondary | ICD-10-CM | POA: Diagnosis not present

## 2023-12-02 DIAGNOSIS — M9901 Segmental and somatic dysfunction of cervical region: Secondary | ICD-10-CM | POA: Diagnosis not present

## 2023-12-02 DIAGNOSIS — M9903 Segmental and somatic dysfunction of lumbar region: Secondary | ICD-10-CM | POA: Diagnosis not present

## 2023-12-16 ENCOUNTER — Ambulatory Visit: Payer: Self-pay

## 2023-12-16 NOTE — Telephone Encounter (Signed)
 Returned call to patient and she is adamant about only seeing Dr. Cleatus. I scheduled her for 12/24/23 and placed on waitlist if something comes available sooner. Patient appreciated call.

## 2023-12-16 NOTE — Telephone Encounter (Signed)
 FYI Only or Action Required?: Action required by provider: refusing appts with other providers, requesting appt with PCP.  Patient was last seen in primary care on 02/11/2023 by Cleatus Arlyss RAMAN, MD.  Called Nurse Triage reporting Memory Loss, Weight Loss, and not eating well.  Symptoms began several months ago.  Interventions attempted: Nothing.  Symptoms are: gradually worsening.  Triage Disposition: See Physician Within 24 Hours  Patient/caregiver understands and will follow disposition?: No, refuses disposition       Copied from CRM [id number]. Topic: Clinical - Red Word Triage >> Dec 16, 2023  9:35 AM [place] J wrote: [facility] that prompted transfer to Nurse Triage: Patient's memory is worsening.    Reason for Disposition  [1] Confusion getting worse AND [2] slow onset (days to weeks)  Answer Assessment - Initial Assessment Questions Advised pt be examined in next 24 hours, offered appts with other providers, daughter prefers pt sees her PCP Dr. Cleatus, he's been expecting me to call him about this, have mutual friend. Sending message to office for appt options/further recommendations, see if can fit into schedule.  1. MAIN CONCERN OR SYMPTOM:  What is your main concern right now? What questions do you have? What's the main symptom you're worried about? (e.g., confusion, memory loss)     Been worsening over the past year, getting to point now where it could get dangerous, really bad Not danger to herself or others Worried she'll forget to eat Forgot even though reminded her 30 min ahead of time needed to pick up grandson, forgot conversation Think both parents forgetting to eat, just mom always been responsible for feeding dad Rabbits at house, always gets lettuce every time at [employer], forgot that they've had conversation that have enough lettuce, fridge full of lettuce now Worried about worsening and if can prevent confusion on the road or something Not sure if diet  contributing to this Mom sweet as can be all the time  2. ONSET:  When did the symptom start (or worsen)? (minutes, hours, days, weeks)     Past year, Seems like increasing over pace of weeks or months, rate of change is greater than it was a year ago 4. DIAGNOSIS: Was the dementia diagnosed by a doctor? If Yes, ask: When? (e.g., days, months, years ago)     No diagnosis 6. OTHER SYMPTOMS: Are there any other symptoms? (e.g., cough, falling, fever, pain)     Weight loss, not eating well 7. SUPPORT: What type of support do you (the patient) have? Note: Document living circumstances and support (e.g., family, nursing home).     Lives with husband, both not eating well but husband doing well cognitively, daughter (caller) is supporting as well  Protocols used: Dementia Symptoms and Questions-A-AH

## 2023-12-21 ENCOUNTER — Ambulatory Visit (INDEPENDENT_AMBULATORY_CARE_PROVIDER_SITE_OTHER): Admitting: Family Medicine

## 2023-12-21 ENCOUNTER — Encounter: Payer: Self-pay | Admitting: Family Medicine

## 2023-12-21 VITALS — BP 138/76 | HR 81 | Temp 98.4°F | Ht 65.0 in | Wt 112.6 lb

## 2023-12-21 DIAGNOSIS — R413 Other amnesia: Secondary | ICD-10-CM

## 2023-12-21 DIAGNOSIS — E559 Vitamin D deficiency, unspecified: Secondary | ICD-10-CM | POA: Diagnosis not present

## 2023-12-21 NOTE — Patient Instructions (Addendum)
 Go to the lab on the way out.   If you have mychart we'll likely use that to update you.    Let me know if you can't get set up with the CT.  Take care.  Glad to see you.

## 2023-12-21 NOTE — Progress Notes (Unsigned)
 Family noted memory changes.  Here today with daughter.  She had recall of directions but gaps in memory re: short term and long term.  She'll have trouble with recall of conversations.  Prev reminders were more effective but now prompting doesn't lead to registration.  She is making more notes to try to compensate. This is different compared to a few years ago.  Gradual changes in the last year or possibly longer, more noted in the last 2 months.    No known h/o CVA.  She had a fall about 1 year ago, with bruising on the scalp, local exterior hematoma at the time.    H/o vit D def.  Recheck pending.   Meds, vitals, and allergies reviewed.   ROS: Per HPI unless specifically indicated in ROS section

## 2023-12-22 ENCOUNTER — Ambulatory Visit: Payer: Self-pay | Admitting: Family Medicine

## 2023-12-22 DIAGNOSIS — R413 Other amnesia: Secondary | ICD-10-CM

## 2023-12-22 DIAGNOSIS — E559 Vitamin D deficiency, unspecified: Secondary | ICD-10-CM | POA: Insufficient documentation

## 2023-12-22 LAB — CBC WITH DIFFERENTIAL/PLATELET
Basophils Absolute: 0.1 K/uL (ref 0.0–0.1)
Basophils Relative: 1.3 % (ref 0.0–3.0)
Eosinophils Absolute: 0.1 K/uL (ref 0.0–0.7)
Eosinophils Relative: 1.2 % (ref 0.0–5.0)
HCT: 40.6 % (ref 36.0–46.0)
Hemoglobin: 13.7 g/dL (ref 12.0–15.0)
Lymphocytes Relative: 25.8 % (ref 12.0–46.0)
Lymphs Abs: 1.5 K/uL (ref 0.7–4.0)
MCHC: 33.6 g/dL (ref 30.0–36.0)
MCV: 92.5 fl (ref 78.0–100.0)
Monocytes Absolute: 0.4 K/uL (ref 0.1–1.0)
Monocytes Relative: 6.6 % (ref 3.0–12.0)
Neutro Abs: 3.9 K/uL (ref 1.4–7.7)
Neutrophils Relative %: 65.1 % (ref 43.0–77.0)
Platelets: 272 K/uL (ref 150.0–400.0)
RBC: 4.39 Mil/uL (ref 3.87–5.11)
RDW: 13.4 % (ref 11.5–15.5)
WBC: 5.9 K/uL (ref 4.0–10.5)

## 2023-12-22 LAB — COMPREHENSIVE METABOLIC PANEL WITH GFR
ALT: 20 U/L (ref 0–35)
AST: 24 U/L (ref 0–37)
Albumin: 4.4 g/dL (ref 3.5–5.2)
Alkaline Phosphatase: 58 U/L (ref 39–117)
BUN: 17 mg/dL (ref 6–23)
CO2: 31 meq/L (ref 19–32)
Calcium: 9 mg/dL (ref 8.4–10.5)
Chloride: 100 meq/L (ref 96–112)
Creatinine, Ser: 0.61 mg/dL (ref 0.40–1.20)
GFR: 87.67 mL/min (ref >60.00)
Glucose, Bld: 76 mg/dL (ref 70–99)
Potassium: 4.2 meq/L (ref 3.5–5.1)
Sodium: 139 meq/L (ref 135–145)
Total Bilirubin: 0.5 mg/dL (ref 0.2–1.2)
Total Protein: 6.4 g/dL (ref 6.0–8.3)

## 2023-12-22 LAB — VITAMIN B12: Vitamin B-12: 990 pg/mL — ABNORMAL HIGH (ref 211–911)

## 2023-12-22 LAB — TSH: TSH: 1.47 u[IU]/mL (ref 0.35–5.50)

## 2023-12-22 LAB — VITAMIN D 25 HYDROXY (VIT D DEFICIENCY, FRACTURES): VITD: 48.11 ng/mL (ref 30.00–100.00)

## 2023-12-22 NOTE — Assessment & Plan Note (Signed)
 History of.  Recheck pending.

## 2023-12-22 NOTE — Assessment & Plan Note (Signed)
 Unclear source.  Differential discussed.  Check labs for reversible causes, CT ordered.  Consider neuropsychological evaluation for additional testing and/or neurology referral versus treatment after getting results.  At this point still okay for outpatient follow-up.  Unclear if fall 1 year ago contributing to her symptoms.  Discussed.

## 2023-12-23 ENCOUNTER — Ambulatory Visit
Admission: RE | Admit: 2023-12-23 | Discharge: 2023-12-23 | Disposition: A | Discharge status: Home / Self Care | Source: Ambulatory Visit | Attending: Family Medicine | Admitting: Family Medicine

## 2023-12-23 DIAGNOSIS — R413 Other amnesia: Secondary | ICD-10-CM | POA: Diagnosis not present

## 2023-12-24 ENCOUNTER — Ambulatory Visit: Admitting: Family Medicine

## 2024-01-03 ENCOUNTER — Ambulatory Visit: Admitting: Family Medicine

## 2024-01-04 ENCOUNTER — Ambulatory Visit (INDEPENDENT_AMBULATORY_CARE_PROVIDER_SITE_OTHER): Admitting: Family Medicine

## 2024-01-04 VITALS — BP 108/70 | HR 80 | Temp 98.2°F | Ht 65.0 in | Wt 113.2 lb

## 2024-01-04 DIAGNOSIS — R42 Dizziness and giddiness: Secondary | ICD-10-CM

## 2024-01-04 DIAGNOSIS — H6990 Unspecified Eustachian tube disorder, unspecified ear: Secondary | ICD-10-CM

## 2024-01-04 DIAGNOSIS — R413 Other amnesia: Secondary | ICD-10-CM

## 2024-01-04 NOTE — Progress Notes (Unsigned)
 Memory change.  D/w pt about getting neuropsych testing and daughter is going to call about setting that up, potentially in [place], [place].   Daughter noted that pt was one day off recently, got Friday vs Saturday confused.  Similar event thinking Tuesday vs Monday.  She is getting timing of events mixed up.  D/w pt about neuro eval. Referral placed.   FH dementia, older brother.   Dizziness but not room spinning.  She can get lightheaded on standing, esp in the AMs.  She tripped in the yard.  She is already taking extra salt.  D/w pt about inc protein intake.    L ETD on exam.

## 2024-01-04 NOTE — Patient Instructions (Addendum)
 Let me know if you can't get set up with the extra memory testing and neurology.  Take care.  Glad to see you. Add extra salt to your food and see if that makes a difference over the next week or so.  Use nasal saline and gently try to pop your ears.

## 2024-01-05 DIAGNOSIS — R42 Dizziness and giddiness: Secondary | ICD-10-CM | POA: Insufficient documentation

## 2024-01-05 NOTE — Assessment & Plan Note (Signed)
 Gently perform Valsalva and use nasal saline.  Discussed.

## 2024-01-05 NOTE — Assessment & Plan Note (Signed)
 Patient daughter can let me know if she can't get set up with the extra memory testing and neurology.

## 2024-01-05 NOTE — Assessment & Plan Note (Signed)
  Add extra salt to food and see if that makes a difference over the next week or so.

## 2024-01-25 ENCOUNTER — Other Ambulatory Visit (HOSPITAL_COMMUNITY): Payer: Self-pay | Admitting: Neurology

## 2024-01-25 DIAGNOSIS — G3184 Mild cognitive impairment, so stated: Secondary | ICD-10-CM | POA: Diagnosis not present

## 2024-01-25 DIAGNOSIS — Z1331 Encounter for screening for depression: Secondary | ICD-10-CM | POA: Diagnosis not present

## 2024-01-25 DIAGNOSIS — R413 Other amnesia: Secondary | ICD-10-CM

## 2024-01-25 DIAGNOSIS — R42 Dizziness and giddiness: Secondary | ICD-10-CM | POA: Diagnosis not present

## 2024-01-31 ENCOUNTER — Ambulatory Visit (HOSPITAL_COMMUNITY)
Admission: RE | Admit: 2024-01-31 | Discharge: 2024-01-31 | Disposition: A | Discharge status: Home / Self Care | Source: Ambulatory Visit | Attending: Neurology | Admitting: Neurology

## 2024-01-31 DIAGNOSIS — R413 Other amnesia: Secondary | ICD-10-CM | POA: Diagnosis not present

## 2024-02-11 ENCOUNTER — Encounter: Payer: Self-pay | Admitting: Family Medicine

## 2024-02-13 ENCOUNTER — Telehealth: Payer: Self-pay | Admitting: Family Medicine

## 2024-02-13 NOTE — Telephone Encounter (Signed)
 Please triage patient about recent leg cramping.  Please find out if it is exertional, recurrent, etc.  Thanks.

## 2024-02-14 NOTE — Telephone Encounter (Signed)
 Noted. Thanks.

## 2024-02-14 NOTE — Telephone Encounter (Signed)
 I spoke with pt; for one month on and off pt having lt mid calf pain that is localized at one spot; no redness or swelling and pain level now is 2 - 3. Pain is not exertional and is more of a discomfort than a pain. Pt has no cP or SOB. Pt scheduled appt with dr Cleatus on 02/15/24 at 12 noon with UC & ED precautions and pt voiced understanding and appreciative of the call. Sending note to Dr Cleatus.

## 2024-02-15 ENCOUNTER — Ambulatory Visit (INDEPENDENT_AMBULATORY_CARE_PROVIDER_SITE_OTHER): Admitting: Family Medicine

## 2024-02-15 ENCOUNTER — Telehealth: Payer: Self-pay | Admitting: Family Medicine

## 2024-02-15 ENCOUNTER — Encounter: Payer: Self-pay | Admitting: Family Medicine

## 2024-02-15 VITALS — BP 116/70 | HR 78 | Temp 97.8°F | Ht 60.0 in | Wt 111.8 lb

## 2024-02-15 DIAGNOSIS — R252 Cramp and spasm: Secondary | ICD-10-CM | POA: Diagnosis not present

## 2024-02-15 DIAGNOSIS — R413 Other amnesia: Secondary | ICD-10-CM | POA: Diagnosis not present

## 2024-02-15 MED ORDER — SODIUM CHLORIDE 1 G PO TABS: 1.0000 g | ORAL_TABLET | Freq: Every day | ORAL | 1 refills | Status: DC

## 2024-02-15 NOTE — Telephone Encounter (Signed)
 Please call neurology about having them follow up with the patient/family about prev labs.

## 2024-02-15 NOTE — Patient Instructions (Addendum)
 I would move the magnesium to bedtime.  If that doesn't help, then try taking salt tabs at night.  Update me as needed.  Please call neurology about follow up.  Take care.  Glad to see you.

## 2024-02-15 NOTE — Progress Notes (Unsigned)
 She had abnormal labs per neurology, d/w pt.  I need input from neurology, discussed.    Family was asking about Sjogren's as a possible dx, with h/o dry mouth.  I do not see where that lab was completed per neurology.  Discussed.  No leg cramping with walking.  Only with sx at rest.  She increased her salt intake in the meantime.  Lightheadedness may be some better.   Pain in the L lateral calf.  She can have R>L leg/shin cramping with resultant dorsiflexion of the feet.  Tends to happen more at night.  Going on for years.  Not an acute/recent issue.  She is taking magnesium at baseline, but not at bedtime.  She is trying to move her bedtime sooner, back to ~10pm.    Meds, vitals, and allergies reviewed.   ROS: Per HPI unless specifically indicated in ROS section   Nad Ncat Neck supple no LA Rrr Ctab Normal radial pulses B Normal skin perfusion to the extremities x 4.

## 2024-02-16 DIAGNOSIS — R252 Cramp and spasm: Secondary | ICD-10-CM | POA: Insufficient documentation

## 2024-02-16 NOTE — Telephone Encounter (Signed)
 Did they address her labs that were done at neuro?  I need neuro to address those.  Thanks.

## 2024-02-16 NOTE — Assessment & Plan Note (Signed)
 I asked patient daughter to follow-up with neurology.

## 2024-02-16 NOTE — Telephone Encounter (Signed)
 Yes, based on the conversation that I had with the office. It appears that the daughter made a phone call and was able to speak with the nurse. That is when they scheduled her to be seen as well.

## 2024-02-16 NOTE — Assessment & Plan Note (Signed)
 No claudication, no reason to suspect vascular issue. Tends to happen more at night. I would move the magnesium to bedtime.  If that doesn't help, then try taking salt tabs at night.  Update me as needed.  Continue with liberal salt and fluid intake.

## 2024-02-16 NOTE — Telephone Encounter (Signed)
 Patient was actually contacted yesterday afternoon and she is scheduled to be seen there on 10/28 at 3:30pm

## 2024-02-16 NOTE — Telephone Encounter (Signed)
 Noted. Thanks.

## 2024-02-23 DIAGNOSIS — G3184 Mild cognitive impairment, so stated: Secondary | ICD-10-CM | POA: Diagnosis not present

## 2024-03-01 ENCOUNTER — Ambulatory Visit: Payer: Medicare Other

## 2024-03-01 VITALS — Ht 65.0 in | Wt 111.0 lb

## 2024-03-01 DIAGNOSIS — Z13820 Encounter for screening for osteoporosis: Secondary | ICD-10-CM

## 2024-03-01 DIAGNOSIS — M810 Age-related osteoporosis without current pathological fracture: Secondary | ICD-10-CM | POA: Diagnosis not present

## 2024-03-01 DIAGNOSIS — Z0000 Encounter for general adult medical examination without abnormal findings: Secondary | ICD-10-CM | POA: Diagnosis not present

## 2024-03-01 NOTE — Progress Notes (Signed)
 Please attest and cosign this visit due to patients primary care provider not being in the office at the time the visit was completed.    Subjective:   Jeanne Webb is a 73 y.o. who presents for a Medicare Wellness preventive visit.  As a reminder, Annual Wellness Visits don't include a physical exam, and some assessments may be limited, especially if this visit is performed virtually. We may recommend an in-person follow-up visit with your provider if needed.  Visit Complete: Virtual I connected with  Jeanne Webb on 03/01/24 by a audio enabled telemedicine application and verified that I am speaking with the correct person using two identifiers.  Patient Location: Home  Provider Location: Home Office  I discussed the limitations of evaluation and management by telemedicine. The patient expressed understanding and agreed to proceed.  Vital Signs: Because this visit was a virtual/telehealth visit, some criteria may be missing or patient reported. Any vitals not documented were not able to be obtained and vitals that have been documented are patient reported.  VideoDeclined- This patient declined Librarian, academic. Therefore the visit was completed with audio only.  Persons Participating in Visit: Patient.  AWV Questionnaire: No: Patient Medicare AWV questionnaire was not completed prior to this visit.  Cardiac Risk Factors include: advanced age (>42men, >47 women);dyslipidemia;sedentary lifestyle     Objective:    Today's Vitals   03/01/24 1013 03/01/24 1014  Weight: 111 lb (50.3 kg)   Height: 5' 5 (1.651 m)   PainSc:  2    Body mass index is 18.47 kg/m.     03/01/2024   10:37 AM 02/23/2023   11:01 AM 09/01/2022    7:39 AM 12/03/2021    8:21 AM 08/05/2020    2:16 PM 05/27/2020    8:53 AM 06/01/2019    8:19 AM  Advanced Directives  Does Patient Have a Medical Advance Directive? No No Yes Yes Yes Yes No  Type of Aeronautical engineer of [place];Living will Healthcare Power of [place];Living will Healthcare Power of [place];Living will Living will;Healthcare Power of [place];Out of facility DNR (pink MOST or yellow form)   Does patient want to make changes to medical advance directive?   No - Patient declined  No - Patient declined No - Patient declined   Copy of Healthcare Power of Attorney in Chart?   No - copy requested No - copy requested No - copy requested No - copy requested   Would patient like information on creating a medical advance directive?  No - Patient declined     Yes (Inpatient - patient defers creating a medical advance directive and declines information at this time)    Current Medications (verified) Outpatient Encounter Medications as of 03/01/2024  Medication Sig   Ascorbic Acid (VITAMIN C) 1000 MG tablet Take 2,000 mg by mouth daily.   Calcium-Magnesium-Zinc (CAL-MAG-ZINC PO) Take 2 tablets by mouth at bedtime.   Cholecalciferol (VITAMIN D3) 25 MCG (1000 UT) CAPS Take 1 capsule (1,000 Units total) by mouth in the morning and at bedtime.   donepezil (ARICEPT) 5 MG tablet Take 5 mg by mouth daily.   Glucosamine HCl (GLUCOSAMINE PO) Take by mouth.   sodium chloride  1 g tablet Take 1 tablet (1 g total) by mouth at bedtime.   VITAMIN A PO Take by mouth daily.   vitamin E 400 UNIT capsule Take 400 Units by mouth daily.    Zinc 40 MG TABS Take by mouth.  No facility-administered encounter medications on file as of 03/01/2024.    Allergies (verified) Water  oral [alimentum], Lactose intolerance (gi), Nickel, Other, Seldane [terfenadine], and Penicillins   History: Past Medical History:  Diagnosis Date   Allergy    Arthritis    COVID-19 08/10/2022   Symptoms resolved 08/17/22   Cramp of limb    Osteoporosis    Plantar fascial fibromatosis    Rotator cuff tear    right   Past Surgical History:  Procedure Laterality Date   CATARACT EXTRACTION W/PHACO Left 09/01/2022    Procedure: CATARACT EXTRACTION PHACO AND INTRAOCULAR LENS PLACEMENT (IOC) LEFT  6.78  00:44.3;  Surgeon: Jaye Fallow, MD;  Location: [facility];  Service: Ophthalmology;  Laterality: Left;   DENTAL SURGERY     squamous cell      Removal from leg   TOTAL HIP ARTHROPLASTY Left 11/09/2017   Procedure: LEFT TOTAL HIP ARTHROPLASTY ANTERIOR APPROACH;  Surgeon: Ernie Cough, MD;  Location: [facility];  Service: Orthopedics;  Laterality: Left;   Family History  Problem Relation Age of Onset   Cancer Mother        leukemia, ovarian, breast   Stroke Father    Heart disease Brother    Colon cancer Neg Hx    Social History   Socioeconomic History   Marital status: Married    Spouse name: Not on file   Number of children: Not on file   Years of education: Not on file   Highest education level: Not on file  Occupational History   Not on file  Tobacco Use   Smoking status: Former    Current packs/day: 0.00    Types: Cigarettes    Start date: 44    Quit date: 88    Years since quitting: 51.7   Smokeless tobacco: Never  Vaping Use   Vaping status: Never Used  Substance and Sexual Activity   Alcohol use: No    Alcohol/week: 0.0 standard drinks of alcohol   Drug use: No   Sexual activity: Not Currently  Other Topics Concern   Not on file  Social History Narrative   From [place], lived in [place].   Artist (painting, realism), owns rental properties.     Married 1974   6 grankids   Social Drivers of Corporate investment banker Strain: Low Risk  (03/01/2024)   Overall Financial Resource Strain (CARDIA)    Difficulty of Paying Living Expenses: Not hard at all  Food Insecurity: No Food Insecurity (03/01/2024)   Hunger Vital Sign    Worried About Running Out of Food in the Last Year: Never true    Ran Out of Food in the Last Year: Never true  Transportation Needs: No Transportation Needs (03/01/2024)   PRAPARE - Administrator, Civil Service (Medical):  No    Lack of Transportation (Non-Medical): No  Physical Activity: Sufficiently Active (03/01/2024)   Exercise Vital Sign    Days of Exercise per Week: 7 days    Minutes of Exercise per Session: 30 min  Stress: No Stress Concern Present (03/01/2024)   [employer] of Occupational Health - Occupational Stress Questionnaire    Feeling of Stress: Not at all  Social Connections: Moderately Integrated (03/01/2024)   Social Connection and Isolation Panel    Frequency of Communication with Friends and Family: More than three times a week    Frequency of Social Gatherings with Friends and Family: Three times a week    Attends  Religious Services: More than 4 times per year    Active Member of Clubs or Organizations: No    Attends Banker Meetings: Never    Marital Status: Married    Tobacco Counseling Counseling given: Not Answered  Clinical Intake:  Pre-visit preparation completed: Yes  Pain : 0-10 Pain Score: 2  Pain Location: Back Pain Orientation: Lower Pain Descriptors / Indicators: Aching Pain Onset: More than a month ago Pain Frequency: Intermittent Pain Relieving Factors: lies flat and firm mattress  Pain Relieving Factors: lies flat and firm mattress  BMI - recorded: 18.47 Nutritional Status: BMI <19  Underweight Nutritional Risks: None Diabetes: No  Lab Results  Component Value Date   HGBA1C 5.3 10/15/2017     How often do you need to have someone help you when you read instructions, pamphlets, or other written materials from your doctor or pharmacy?: 1 - Never  Interpreter Needed?: No  Comments: lives with husband Information entered by :: B.Coree Brame,LPN   Activities of Daily Living     03/01/2024   10:38 AM  In your present state of health, do you have any difficulty performing the following activities:  Hearing? 0  Vision? 0  Difficulty concentrating or making decisions? 1  Walking or climbing stairs? 0  Dressing or bathing? 0  Doing  errands, shopping? 0  Preparing Food and eating ? N  Using the Toilet? N  In the past six months, have you accidently leaked urine? Y  Do you have problems with loss of bowel control? N  Managing your Medications? Y  Managing your Finances? Y  Comment husband and daughter,cleaner helps  Housekeeping or managing your Housekeeping? Y  Comment husband and daughter,cleaner helps    Patient Care Team: Cleatus Arlyss RAMAN, MD as PCP - General (Family Medicine) Cloria Annabella CROME, DO (Geriatric Medicine) Robinson Pao, MD as Consulting Physician (Dermatology) Jaye Fallow, MD as Referring Physician (Ophthalmology)  I have updated your Care Teams any recent Medical Services you may have received from other providers in the past year.     Assessment:   This is a routine wellness examination for Jeanne Webb.  Hearing/Vision screen Hearing Screening - Comments:: Patient denies any hearing difficulties.   Vision Screening - Comments:: Pt says their vision is good with glasses Dr  Gwendel    Goals Addressed             This Visit's Progress    COMPLETED: Patient Stated       12/03/2021, start exercising (swimming)     Patient Stated       03/01/24- (still working on) To get everything in its very own place and get organized     Patient Stated       Be more socially engaged      COMPLETED: Sit cross legged       Patient would like to be able to sit cross legged, patient will work on flexibility       Depression Screen     03/01/2024   10:32 AM 02/15/2024   12:20 PM 12/21/2023   12:33 PM 02/23/2023   11:24 AM 02/23/2023   11:05 AM 02/11/2023    9:50 AM 10/05/2022    4:16 PM  PHQ 2/9 Scores  PHQ - 2 Score 0 0 0 0 0 0 0  PHQ- 9 Score  0 0 0 0 0 0    Fall Risk     03/01/2024   10:19 AM 02/15/2024   12:20 PM  01/04/2024    2:37 PM 12/21/2023   12:32 PM 02/23/2023   11:24 AM  Fall Risk   Falls in the past year? 1 1 1 1  0  Number falls in past yr: 1 0 1 1 0  Injury with Fall? 1 0 1  1 0  Risk for fall due to : No Fall Risks History of fall(s) History of fall(s) History of fall(s) No Fall Risks  Follow up Education provided;Falls prevention discussed Falls evaluation completed Falls evaluation completed Falls evaluation completed Falls prevention discussed    MEDICARE RISK AT HOME:  Medicare Risk at Home Any stairs in or around the home?: Yes If so, are there any without handrails?: Yes Home free of loose throw rugs in walkways, pet beds, electrical cords, etc?: Yes Adequate lighting in your home to reduce risk of falls?: Yes Life alert?: No Use of a cane, walker or w/c?: No Grab bars in the bathroom?: Yes Shower chair or bench in shower?: Yes Elevated toilet seat or a handicapped toilet?: Yes  TIMED UP AND GO:  Was the test performed?  No  Cognitive Function: 6CIT completed    02/10/2017    9:37 AM 01/24/2016    9:39 AM 01/11/2015    9:34 AM  MMSE - Mini Mental State Exam  Orientation to time 5  5  5    Orientation to Place 5  5  5    Registration 3  3  3    Attention/ Calculation 5  5  5    Recall 3  3  3    Language- name 2 objects 2  2  2    Language- repeat 1 1 1   Language- follow 3 step command 3  3  3    Language- read & follow direction 1  1  1    Write a sentence 1  1  1    Copy design 1  1  1    Total score 30  30  30       Data saved with a previous flowsheet row definition        03/01/2024   10:48 AM 02/23/2023   11:04 AM 12/03/2021    8:26 AM 05/27/2020    8:46 AM 05/24/2019    9:31 AM  6CIT Screen  What Year? 0 points 0 points 0 points 0 points 0 points  What month? 0 points 0 points 0 points 0 points 0 points  What time? 0 points 0 points 0 points 0 points 0 points  Count back from 20 0 points 0 points 0 points 0 points 0 points  Months in reverse 0 points 0 points 0 points 0 points 0 points  Repeat phrase 0 points 0 points 0 points 0 points 0 points  Total Score 0 points 0 points 0 points 0 points 0 points    Immunizations Immunization  History  Administered Date(s) Administered   PFIZER(Purple Top)SARS-COV-2 Vaccination 01/12/2020, 01/31/2020    Screening Tests Health Maintenance  Topic Date Due   DTaP/Tdap/Td (1 - Tdap) Never done   Zoster Vaccines- Shingrix (1 of 2) Never done   Colonoscopy  Never done   Pneumococcal Vaccine: 50+ Years (1 of 1 - PCV) Never done   DEXA SCAN  06/27/2022   COVID-19 Vaccine (3 - Pfizer risk series) 03/06/2028 (Originally 02/28/2020)   Mammogram  07/13/2024   Medicare Annual Wellness (AWV)  03/01/2025   Hepatitis C Screening  Completed   HPV VACCINES  Aged Out   Meningococcal B Vaccine  Aged Out  Health Maintenance Items Addressed: DEXA ordered  Additional Screening:  Vision Screening: Recommended annual ophthalmology exams for early detection of glaucoma and other disorders of the eye. Is the patient up to date with their annual eye exam?  Yes  Who is the provider or what is the name of the office in which the patient attends annual eye exams? Dr Jaye  Dental Screening: Recommended annual dental exams for proper oral hygiene  Community Resource Referral / Chronic Care Management: CRR required this visit?  No   CCM required this visit?  Appt scheduled with PCP   Plan:    I have personally reviewed and noted the following in the patient's chart:   Medical and social history Use of alcohol, tobacco or illicit drugs  Current medications and supplements including opioid prescriptions. Patient is not currently taking opioid prescriptions. Functional ability and status Nutritional status Physical activity Advanced directives List of other physicians Hospitalizations, surgeries, and ER visits in previous 12 months Vitals Screenings to include cognitive, depression, and falls Referrals and appointments  In addition, I have reviewed and discussed with patient certain preventive protocols, quality metrics, and best practice recommendations. A written personalized  care plan for preventive services as well as general preventive health recommendations were provided to patient.   Jeanne LITTIE Saris, LPN   [id number]   After Visit Summary: (MyChart) Due to this being a telephonic visit, the after visit summary with patients personalized plan was offered to patient via MyChart   Notes: Please refer to Routing Comments.

## 2024-03-01 NOTE — Patient Instructions (Signed)
 [name],  Thank you for taking the time for your Medicare Wellness Visit. I appreciate your continued commitment to your health goals. Please review the care plan we discussed, and feel free to reach out if I can assist you further.  Medicare recommends these wellness visits once per year to help you and your care team stay ahead of potential health issues. These visits are designed to focus on prevention, allowing your provider to concentrate on managing your acute and chronic conditions during your regular appointments.  Please note that Annual Wellness Visits do not include a physical exam. Some assessments may be limited, especially if the visit was conducted virtually. If needed, we may recommend a separate in-person follow-up with your provider.  Ongoing Care Seeing your primary care provider every 3 to 6 months helps us  monitor your health and provide consistent, personalized care.   Referrals If a referral was made during today's visit and you haven't received any updates within two weeks, please contact the referred provider directly to check on the status.  Recommended Screenings:  Health Maintenance  Topic Date Due   DTaP/Tdap/Td vaccine (1 - Tdap) Never done   Zoster (Shingles) Vaccine (1 of 2) Never done   Colon Cancer Screening  Never done   Pneumococcal Vaccine for age over 14 (1 of 1 - PCV) Never done   DEXA scan (bone density measurement)  06/27/2022   COVID-19 Vaccine (3 - Pfizer risk series) 03/06/2028*   Breast Cancer Screening  07/13/2024   Medicare Annual Wellness Visit  03/01/2025   Hepatitis C Screening  Completed   HPV Vaccine  Aged Out   Meningitis B Vaccine  Aged Out  *Topic was postponed. The date shown is not the original due date.       02/23/2023   11:01 AM  Advanced Directives  Does Patient Have a Medical Advance Directive? No  Would patient like information on creating a medical advance directive? No - Patient declined   Advance Care Planning  is important because it: Ensures you receive medical care that aligns with your values, goals, and preferences. Provides guidance to your family and loved ones, reducing the emotional burden of decision-making during critical moments.  Vision: Annual vision screenings are recommended for early detection of glaucoma, cataracts, and diabetic retinopathy. These exams can also reveal signs of chronic conditions such as diabetes and high blood pressure.  Dental: Annual dental screenings help detect early signs of oral cancer, gum disease, and other conditions linked to overall health, including heart disease and diabetes.  Please see the attached documents for additional preventive care recommendations.

## 2024-04-04 DIAGNOSIS — R42 Dizziness and giddiness: Secondary | ICD-10-CM | POA: Diagnosis not present

## 2024-04-04 DIAGNOSIS — G3184 Mild cognitive impairment, so stated: Secondary | ICD-10-CM | POA: Diagnosis not present

## 2024-04-13 ENCOUNTER — Ambulatory Visit (INDEPENDENT_AMBULATORY_CARE_PROVIDER_SITE_OTHER): Admitting: Family Medicine

## 2024-04-13 ENCOUNTER — Encounter: Payer: Self-pay | Admitting: Family Medicine

## 2024-04-13 VITALS — BP 110/68 | HR 58 | Temp 98.4°F | Ht 65.0 in | Wt 114.0 lb

## 2024-04-13 DIAGNOSIS — M810 Age-related osteoporosis without current pathological fracture: Secondary | ICD-10-CM

## 2024-04-13 DIAGNOSIS — R413 Other amnesia: Secondary | ICD-10-CM

## 2024-04-13 DIAGNOSIS — M3501 Sicca syndrome with keratoconjunctivitis: Secondary | ICD-10-CM | POA: Diagnosis not present

## 2024-04-13 DIAGNOSIS — Z961 Presence of intraocular lens: Secondary | ICD-10-CM | POA: Diagnosis not present

## 2024-04-13 DIAGNOSIS — H4911 Fourth [trochlear] nerve palsy, right eye: Secondary | ICD-10-CM | POA: Diagnosis not present

## 2024-04-13 MED ORDER — DONEPEZIL HCL 10 MG PO TABS: ORAL_TABLET | ORAL | Status: AC

## 2024-04-13 NOTE — Patient Instructions (Signed)
 You can call for a bone density test at [facility] [address] [place] [address] [phone]  Take care.  Glad to see you.

## 2024-04-13 NOTE — Progress Notes (Signed)
 She added salt to food and didn't start salt tablet.  She had decreased her water  intake to help with limiting hyponatremia.  Previous labs discussed.  Discussed previous neurology evaluation and treatment.  She had increase in donezepil to 10mg .  Per neurology.  She didn't recall info from the neuro appointment, discussed.  D/w pt about memory overall, she had some improvement with starting donepezil and also with exercise.  D/w pt about continuing to maintain sodium intake.    Mammogram 2025 Okay to defer colon cancer screening 2025.   DXA 2022.  Ordered 2025.  Discussed rationale for follow-up bone density test.  If she is going to consider treatment then we need to see what her bone density is at this point.  Meds, vitals, and allergies reviewed.   ROS: Per HPI unless specifically indicated in ROS section   GEN: nad, alert and pleasant in conversation. HEENT: mucous membranes moist NECK: supple w/o LA CV: rrr.  PULM: ctab, no inc wob ABD: soft, +bs EXT: no edema SKIN: Well-perfused.  32 minutes were devoted to patient care in this encounter (this includes time spent reviewing the patient's file/history, interviewing and examining the patient, counseling/reviewing plan with patient).

## 2024-04-16 NOTE — Assessment & Plan Note (Signed)
 DXA 2022.  Ordered 2025.  Discussed rationale for follow-up bone density test.  If she is going to consider treatment then we need to see what her bone density is at this point.

## 2024-04-16 NOTE — Assessment & Plan Note (Signed)
 Can work on diet and exercise.  Continue donepezil.  See above.

## 2024-06-29 ENCOUNTER — Ambulatory Visit: Admitting: Dermatology

## 2025-03-02 ENCOUNTER — Ambulatory Visit
